# Patient Record
Sex: Male | Born: 1985
Health system: Southern US, Community
[De-identification: ages and names within clinical notes are randomized; demographics above are authoritative.]

## PROBLEM LIST (undated history)

## (undated) DIAGNOSIS — Z8659 Personal history of other mental and behavioral disorders: Secondary | ICD-10-CM

## (undated) DIAGNOSIS — T7840XA Allergy, unspecified, initial encounter: Secondary | ICD-10-CM

## (undated) DIAGNOSIS — E7800 Pure hypercholesterolemia, unspecified: Secondary | ICD-10-CM

## (undated) DIAGNOSIS — F32A Depression, unspecified: Secondary | ICD-10-CM

## (undated) DIAGNOSIS — J45909 Unspecified asthma, uncomplicated: Secondary | ICD-10-CM

## (undated) DIAGNOSIS — R911 Solitary pulmonary nodule: Secondary | ICD-10-CM

## (undated) DIAGNOSIS — G43909 Migraine, unspecified, not intractable, without status migrainosus: Secondary | ICD-10-CM

## (undated) DIAGNOSIS — I493 Ventricular premature depolarization: Secondary | ICD-10-CM

## (undated) HISTORY — PX: WISDOM TOOTH EXTRACTION: SHX21

## (undated) HISTORY — DX: Unspecified asthma, uncomplicated: J45.909

## (undated) HISTORY — DX: Depression, unspecified: F32.A

## (undated) HISTORY — DX: Solitary pulmonary nodule: R91.1

## (undated) HISTORY — DX: Migraine, unspecified, not intractable, without status migrainosus: G43.909

## (undated) HISTORY — DX: Pure hypercholesterolemia, unspecified: E78.00

## (undated) HISTORY — DX: Personal history of other mental and behavioral disorders: Z86.59

## (undated) HISTORY — DX: Ventricular premature depolarization: I49.3

## (undated) HISTORY — DX: Allergy, unspecified, initial encounter: T78.40XA

---

## 2018-12-29 ENCOUNTER — Telehealth: Payer: Self-pay

## 2018-12-29 NOTE — Telephone Encounter (Signed)
Questions for Screening COVID-19   During this illness, did/does the patient experience any of the following symptoms? Fever >100.1F []   Yes [x]   No []   Unknown Subjective fever (felt feverish) []   Yes [x]   No []   Unknown Chills []   Yes [x]   No []   Unknown Muscle aches (myalgia) []   Yes [x]   No []   Unknown Runny nose (rhinorrhea) []   Yes [x]   No []   Unknown Sore throat []   Yes [x]   No []   Unknown Cough (new onset or worsening of chronic cough) []   Yes [x]   No []   Unknown Shortness of breath (dyspnea) []   Yes []   No []   Unknown Nausea or vomiting []   Yes [x]   No []   Unknown Headache []   Yes [x]   No []   Unknown Abdominal pain  []   Yes [x]   No []   Unknown Diarrhea (?3 loose/looser than normal stools/24hr period) []   Yes [x]   No []   Unknown Other, specify:

## 2018-12-30 ENCOUNTER — Ambulatory Visit (INDEPENDENT_AMBULATORY_CARE_PROVIDER_SITE_OTHER): Payer: 59 | Admitting: Family Medicine

## 2018-12-30 ENCOUNTER — Other Ambulatory Visit: Payer: Self-pay

## 2018-12-30 ENCOUNTER — Telehealth: Payer: Self-pay

## 2018-12-30 ENCOUNTER — Encounter: Payer: Self-pay | Admitting: Family Medicine

## 2018-12-30 VITALS — BP 130/80 | HR 91 | Ht 72.0 in | Wt 162.2 lb

## 2018-12-30 DIAGNOSIS — Z0000 Encounter for general adult medical examination without abnormal findings: Secondary | ICD-10-CM

## 2018-12-30 DIAGNOSIS — R0789 Other chest pain: Secondary | ICD-10-CM

## 2018-12-30 DIAGNOSIS — Z720 Tobacco use: Secondary | ICD-10-CM | POA: Diagnosis not present

## 2018-12-30 DIAGNOSIS — I493 Ventricular premature depolarization: Secondary | ICD-10-CM

## 2018-12-30 NOTE — Patient Instructions (Signed)
Preventive Care 19-34 Years Old, Male Preventive care refers to lifestyle choices and visits with your health care provider that can promote health and wellness. This includes:  A yearly physical exam. This is also called an annual well check.  Regular dental and eye exams.  Immunizations.  Screening for certain conditions.  Healthy lifestyle choices, such as eating a healthy diet, getting regular exercise, not using drugs or products that contain nicotine and tobacco, and limiting alcohol use. What can I expect for my preventive care visit? Physical exam Your health care provider will check:  Height and weight. These may be used to calculate body mass index (BMI), which is a measurement that tells if you are at a healthy weight.  Heart rate and blood pressure.  Your skin for abnormal spots. Counseling Your health care provider may ask you questions about:  Alcohol, tobacco, and drug use.  Emotional well-being.  Home and relationship well-being.  Sexual activity.  Eating habits.  Work and work Statistician. What immunizations do I need?  Influenza (flu) vaccine  This is recommended every year. Tetanus, diphtheria, and pertussis (Tdap) vaccine  You may need a Td booster every 10 years. Varicella (chickenpox) vaccine  You may need this vaccine if you have not already been vaccinated. Human papillomavirus (HPV) vaccine  If recommended by your health care provider, you may need three doses over 6 months. Measles, mumps, and rubella (MMR) vaccine  You may need at least one dose of MMR. You may also need a second dose. Meningococcal conjugate (MenACWY) vaccine  One dose is recommended if you are 45-76 years old and a Market researcher living in a residence hall, or if you have one of several medical conditions. You may also need additional booster doses. Pneumococcal conjugate (PCV13) vaccine  You may need this if you have certain conditions and were not  previously vaccinated. Pneumococcal polysaccharide (PPSV23) vaccine  You may need one or two doses if you smoke cigarettes or if you have certain conditions. Hepatitis A vaccine  You may need this if you have certain conditions or if you travel or work in places where you may be exposed to hepatitis A. Hepatitis B vaccine  You may need this if you have certain conditions or if you travel or work in places where you may be exposed to hepatitis B. Haemophilus influenzae type b (Hib) vaccine  You may need this if you have certain risk factors. You may receive vaccines as individual doses or as more than one vaccine together in one shot (combination vaccines). Talk with your health care provider about the risks and benefits of combination vaccines. What tests do I need? Blood tests  Lipid and cholesterol levels. These may be checked every 5 years starting at age 17.  Hepatitis C test.  Hepatitis B test. Screening   Diabetes screening. This is done by checking your blood sugar (glucose) after you have not eaten for a while (fasting).  Sexually transmitted disease (STD) testing. Talk with your health care provider about your test results, treatment options, and if necessary, the need for more tests. Follow these instructions at home: Eating and drinking   Eat a diet that includes fresh fruits and vegetables, whole grains, lean protein, and low-fat dairy products.  Take vitamin and mineral supplements as recommended by your health care provider.  Do not drink alcohol if your health care provider tells you not to drink.  If you drink alcohol: ? Limit how much you have to 0-2  drinks a day. ? Be aware of how much alcohol is in your drink. In the [place] one drink equals one 12 oz bottle of beer (355 mL), one 5 oz glass of wine (148 mL), or one 1 oz glass of hard liquor (44 mL). Lifestyle  Take daily care of your teeth and gums.  Stay active. Exercise for at least 30 minutes on 5 or  more days each week.  Do not use any products that contain nicotine or tobacco, such as cigarettes, e-cigarettes, and chewing tobacco. If you need help quitting, ask your health care provider.  If you are sexually active, practice safe sex. Use a condom or other form of protection to prevent STIs (sexually transmitted infections). What's next?  Go to your health care provider once a year for a well check visit.  Ask your health care provider how often you should have your eyes and teeth checked.  Stay up to date on all vaccines. This information is not intended to replace advice given to you by your health care provider. Make sure you discuss any questions you have with your health care provider. Document Released: 06/24/2001 Document Revised: 04/22/2018 Document Reviewed: 04/22/2018 Elsevier Patient Education  2020 [employer]  Health Maintenance, Male Adopting a healthy lifestyle and getting preventive care are important in promoting health and wellness. Ask your health care provider about:  The right schedule for you to have regular tests and exams.  Things you can do on your own to prevent diseases and keep yourself healthy. What should I know about diet, weight, and exercise? Eat a healthy diet   Eat a diet that includes plenty of vegetables, fruits, low-fat dairy products, and lean protein.  Do not eat a lot of foods that are high in solid fats, added sugars, or sodium. Maintain a healthy weight Body mass index (BMI) is a measurement that can be used to identify possible weight problems. It estimates body fat based on height and weight. Your health care provider can help determine your BMI and help you achieve or maintain a healthy weight. Get regular exercise Get regular exercise. This is one of the most important things you can do for your health. Most adults should:  Exercise for at least 150 minutes each week. The exercise should increase your heart rate and make you  sweat (moderate-intensity exercise).  Do strengthening exercises at least twice a week. This is in addition to the moderate-intensity exercise.  Spend less time sitting. Even light physical activity can be beneficial. Watch cholesterol and blood lipids Have your blood tested for lipids and cholesterol at 34 years of age, then have this test every 5 years. You may need to have your cholesterol levels checked more often if:  Your lipid or cholesterol levels are high.  You are older than 34 years of age.  You are at high risk for heart disease. What should I know about cancer screening? Many types of cancers can be detected early and may often be prevented. Depending on your health history and family history, you may need to have cancer screening at various ages. This may include screening for:  Colorectal cancer.  Prostate cancer.  Skin cancer.  Lung cancer. What should I know about heart disease, diabetes, and high blood pressure? Blood pressure and heart disease  High blood pressure causes heart disease and increases the risk of stroke. This is more likely to develop in people who have high blood pressure readings, are of African descent, or are   overweight.  Talk with your health care provider about your target blood pressure readings.  Have your blood pressure checked: ? Every 3-5 years if you are 18-39 years of age. ? Every year if you are 40 years old or older.  If you are between the ages of 65 and 75 and are a current or former smoker, ask your health care provider if you should have a one-time screening for abdominal aortic aneurysm (AAA). Diabetes Have regular diabetes screenings. This checks your fasting blood sugar level. Have the screening done:  Once every three years after age 45 if you are at a normal weight and have a low risk for diabetes.  More often and at a younger age if you are overweight or have a high risk for diabetes. What should I know about  preventing infection? Hepatitis B If you have a higher risk for hepatitis B, you should be screened for this virus. Talk with your health care provider to find out if you are at risk for hepatitis B infection. Hepatitis C Blood testing is recommended for:  Everyone born from 1945 through 1965.  Anyone with known risk factors for hepatitis C. Sexually transmitted infections (STIs)  You should be screened each year for STIs, including gonorrhea and chlamydia, if: ? You are sexually active and are younger than 34 years of age. ? You are older than 34 years of age and your health care provider tells you that you are at risk for this type of infection. ? Your sexual activity has changed since you were last screened, and you are at increased risk for chlamydia or gonorrhea. Ask your health care provider if you are at risk.  Ask your health care provider about whether you are at high risk for HIV. Your health care provider may recommend a prescription medicine to help prevent HIV infection. If you choose to take medicine to prevent HIV, you should first get tested for HIV. You should then be tested every 3 months for as long as you are taking the medicine. Follow these instructions at home: Lifestyle  Do not use any products that contain nicotine or tobacco, such as cigarettes, e-cigarettes, and chewing tobacco. If you need help quitting, ask your health care provider.  Do not use street drugs.  Do not share needles.  Ask your health care provider for help if you need support or information about quitting drugs. Alcohol use  Do not drink alcohol if your health care provider tells you not to drink.  If you drink alcohol: ? Limit how much you have to 0-2 drinks a day. ? Be aware of how much alcohol is in your drink. In the [place] one drink equals one 12 oz bottle of beer (355 mL), one 5 oz glass of wine (148 mL), or one 1 oz glass of hard liquor (44 mL). General instructions  Schedule  regular health, dental, and eye exams.  Stay current with your vaccines.  Tell your health care provider if: ? You often feel depressed. ? You have ever been abused or do not feel safe at home. Summary  Adopting a healthy lifestyle and getting preventive care are important in promoting health and wellness.  Follow your health care provider's instructions about healthy diet, exercising, and getting tested or screened for diseases.  Follow your health care provider's instructions on monitoring your cholesterol and blood pressure. This information is not intended to replace advice given to you by your health care provider. Make sure you discuss   any questions you have with your health care provider. Document Released: 10/25/2007 Document Revised: 04/21/2018 Document Reviewed: 04/21/2018 Elsevier Patient Education  2020 [employer].  Steps to Quit Smoking Smoking tobacco is the leading cause of preventable death. It can affect almost every organ in the body. Smoking puts you and those around you at risk for developing many serious chronic diseases. Quitting smoking can be difficult, but it is one of the best things that you can do for your health. It is never too late to quit. How do I get ready to quit? When you decide to quit smoking, create a plan to help you succeed. Before you quit:  Pick a date to quit. Set a date within the next 2 weeks to give you time to prepare.  Write down the reasons why you are quitting. Keep this list in places where you will see it often.  Tell your family, friends, and co-workers that you are quitting. Support from your loved ones can make quitting easier.  Talk with your health care provider about your options for quitting smoking.  Find out what treatment options are covered by your health insurance.  Identify people, places, things, and activities that make you want to smoke (triggers). Avoid them. What first steps can I take to quit smoking?   Throw away all cigarettes at home, at work, and in your car.  Throw away smoking accessories, such as Scientist, research (medical).  Clean your car. Make sure to empty the ashtray.  Clean your home, including curtains and carpets. What strategies can I use to quit smoking? Talk with your health care provider about combining strategies, such as taking medicines while you are also receiving in-person counseling. Using these two strategies together makes you more likely to succeed in quitting than if you used either strategy on its own.  If you are pregnant or breastfeeding, talk with your health care provider about finding counseling or other support strategies to quit smoking. Do not take medicine to help you quit smoking unless your health care provider tells you to do so. To quit smoking: Quit right away  Quit smoking completely, instead of gradually reducing how much you smoke over a period of time. Research shows that stopping smoking right away is more successful than gradually quitting.  Attend in-person counseling to help you build problem-solving skills. You are more likely to succeed in quitting if you attend counseling sessions regularly. Even short sessions of 10 minutes can be effective. Take medicine You may take medicines to help you quit smoking. Some medicines require a prescription and some you can purchase over-the-counter. Medicines may have nicotine in them to replace the nicotine in cigarettes. Medicines may:  Help to stop cravings.  Help to relieve withdrawal symptoms. Your health care provider may recommend:  Nicotine patches, gum, or lozenges.  Nicotine inhalers or sprays.  Non-nicotine medicine that is taken by mouth. Find resources Find resources and support systems that can help you to quit smoking and remain smoke-free after you quit. These resources are most helpful when you use them often. They include:  Online chats with a Social worker.  Telephone quitlines.   Printed Furniture conservator/restorer.  Support groups or group counseling.  Text messaging programs.  Mobile phone apps or applications. Use apps that can help you stick to your quit plan by providing reminders, tips, and encouragement. There are many free apps for mobile devices as well as websites. Examples include Quit Guide from the [employer] and smokefree.gov What things  can I do to make it easier to quit?   Reach out to your family and friends for support and encouragement. Call telephone quitlines (1-800-QUIT-NOW), reach out to support groups, or work with a counselor for support.  Ask people who smoke to avoid smoking around you.  Avoid places that trigger you to smoke, such as bars, parties, or smoke-break areas at work.  Spend time with people who do not smoke.  Lessen the stress in your life. Stress can be a smoking trigger for some people. To lessen stress, try: ? Exercising regularly. ? Doing deep-breathing exercises. ? Doing yoga. ? Meditating. ? Performing a body scan. This involves closing your eyes, scanning your body from head to toe, and noticing which parts of your body are particularly tense. Try to relax the muscles in those areas. How will I feel when I quit smoking? Day 1 to 3 weeks Within the first 24 hours of quitting smoking, you may start to feel withdrawal symptoms. These symptoms are usually most noticeable 2-3 days after quitting, but they usually do not last for more than 2-3 weeks. You may experience these symptoms:  Mood swings.  Restlessness, anxiety, or irritability.  Trouble concentrating.  Dizziness.  Strong cravings for sugary foods and nicotine.  Mild weight gain.  Constipation.  Nausea.  Coughing or a sore throat.  Changes in how the medicines that you take for unrelated issues work in your body.  Depression.  Trouble sleeping (insomnia). Week 3 and afterward After the first 2-3 weeks of quitting, you may start to notice more positive  results, such as:  Improved sense of smell and taste.  Decreased coughing and sore throat.  Slower heart rate.  Lower blood pressure.  Clearer skin.  The ability to breathe more easily.  Fewer sick days. Quitting smoking can be very challenging. Do not get discouraged if you are not successful the first time. Some people need to make many attempts to quit before they achieve long-term success. Do your best to stick to your quit plan, and talk with your health care provider if you have any questions or concerns. Summary  Smoking tobacco is the leading cause of preventable death. Quitting smoking is one of the best things that you can do for your health.  When you decide to quit smoking, create a plan to help you succeed.  Quit smoking right away, not slowly over a period of time.  When you start quitting, seek help from your health care provider, family, or friends. This information is not intended to replace advice given to you by your health care provider. Make sure you discuss any questions you have with your health care provider. Document Released: 04/22/2001 Document Revised: 07/16/2018 Document Reviewed: 07/17/2018 Elsevier Patient Education  2020 [employer].

## 2018-12-30 NOTE — Progress Notes (Addendum)
Established Patient Office Visit  Subjective:  Patient ID: Joseph Velasquez, male    DOB: 13-Jun-1985  Age: 33 y.o. MRN: [id number]  CC:  Chief Complaint  Patient presents with  . Establish Care    HPI ALEXJANDRO Velasquez presents for establishment of care and follow-up of few issues.  Patient has a past medical history of PVCs.  He is status post work-up with Holter monitoring.  He was advised to decrease his caffeine intake and to quit smoking.  Decreasing caffeine intake helped a great deal.  PVCs have not bothered him much recently but he is concerned about his heart.  His father does not go to the doctor.  He has 2 paternal uncles who developed coronary artery disease in their 87s.  Patient feels stressed.  He moved into this area back in January from [place].  He is worried about his family in [place].  He is married and has no children.  He and his wife are getting along well.  He repairs MRI and CT machines.  He exercises regularly.  He has noted some pain under his left collarbone that was not associated with exertion except for one time when he was hiking up a steep cliff.  He has not been experiencing shortness of breath nausea or vomiting or diaphoresis.  He tells me that blood work checked back in 2018 to include CMP, CBC TSH free T3 were all normal.  He had taken an antidepressant and Ambien for sleep after leaving the [employer] some years ago.  Patient averages about 3 beers daily.  He has 1 after work.  One with dinner and then 1 after dinner with his wife.  History reviewed. No pertinent past medical history.  History reviewed. No pertinent surgical history.  History reviewed. No pertinent family history.  Social History   Socioeconomic History  . Marital status: Married    Spouse name: Not on file  . Number of children: Not on file  . Years of education: Not on file  . Highest education level: Not on file  Occupational History  . Not on file  Social Needs  . Financial  resource strain: Not on file  . Food insecurity    Worry: Not on file    Inability: Not on file  . Transportation needs    Medical: Not on file    Non-medical: Not on file  Tobacco Use  . Smoking status: Current Every Day Smoker  . Smokeless tobacco: Never Used  Substance and Sexual Activity  . Alcohol use: Yes    Comment: 3 beers daily  . Drug use: Never  . Sexual activity: Not on file  Lifestyle  . Physical activity    Days per week: Not on file    Minutes per session: Not on file  . Stress: Not on file  Relationships  . Social Herbalist on phone: Not on file    Gets together: Not on file    Attends religious service: Not on file    Active member of club or organization: Not on file    Attends meetings of clubs or organizations: Not on file    Relationship status: Not on file  . Intimate partner violence    Fear of current or ex partner: Not on file    Emotionally abused: Not on file    Physically abused: Not on file    Forced sexual activity: Not on file  Other Topics Concern  . Not  on file  Social History Narrative  . Not on file    No outpatient medications prior to visit.   No facility-administered medications prior to visit.     Not on File  ROS Review of Systems  Constitutional: Negative for chills, diaphoresis, fatigue, fever and unexpected weight change.  HENT: Negative.   Eyes: Negative for photophobia and visual disturbance.  Respiratory: Negative.   Cardiovascular: Negative.   Gastrointestinal: Negative.   Endocrine: Negative for polyphagia and polyuria.  Genitourinary: Negative for difficulty urinating and urgency.  Musculoskeletal: Negative for gait problem and joint swelling.  Skin: Negative for pallor and rash.  Allergic/Immunologic: Negative for immunocompromised state.  Neurological: Negative for seizures, light-headedness and headaches.  Hematological: Does not bruise/bleed easily.  Psychiatric/Behavioral: Negative.         Depression screen [facility] 2/9 12/30/2018  Decreased Interest 0  Down, Depressed, Hopeless 0  PHQ - 2 Score 0  Altered sleeping 2  Tired, decreased energy 1  Change in appetite 1  Feeling bad or failure about yourself  1  Trouble concentrating 0  Moving slowly or fidgety/restless 1  Suicidal thoughts 0  PHQ-9 Score 6    Objective:    Physical Exam  Constitutional: He is oriented to person, place, and time. He appears well-developed and well-nourished. No distress.  HENT:  Head: Normocephalic and atraumatic.  Right Ear: External ear normal.  Left Ear: External ear normal.  Mouth/Throat: Oropharynx is clear and moist. No oropharyngeal exudate.  Eyes: Pupils are equal, round, and reactive to light. Conjunctivae are normal. Right eye exhibits no discharge. Left eye exhibits no discharge. No scleral icterus.  Neck: Neck supple. No JVD present. No tracheal deviation present. No thyromegaly present.  Cardiovascular: Normal rate, regular rhythm and normal heart sounds.  No extrasystoles are present.  No murmur heard. Pulmonary/Chest: Effort normal and breath sounds normal. No stridor. No respiratory distress. He has no wheezes. He has no rales.  Abdominal: Bowel sounds are normal.  Musculoskeletal:        General: No edema.  Lymphadenopathy:    He has no cervical adenopathy.  Neurological: He is alert and oriented to person, place, and time.  Skin: Skin is warm and dry. He is not diaphoretic.  Psychiatric: He has a normal mood and affect. His behavior is normal.    BP 130/80   Pulse 91   Ht 6' (1.829 m)   Wt 162 lb 3 oz (73.6 kg)   BMI 22.00 kg/m  Wt Readings from Last 3 Encounters:  12/30/18 162 lb 3 oz (73.6 kg)   BP Readings from Last 3 Encounters:  12/30/18 130/80   Guideline developer:  UpToDate (see UpToDate for funding source) Date Released: June 2014  Health Maintenance Due  Topic Date Due  . HIV Screening  12/21/1999  . TETANUS/TDAP  12/21/2003  . INFLUENZA  VACCINE  12/11/2018    There are no preventive care reminders to display for this patient.  Lab Results  Component Value Date   TSH 0.98 12/31/2018   Lab Results  Component Value Date   WBC 5.2 12/31/2018   HGB 15.4 12/31/2018   HCT 44.3 12/31/2018   MCV 93.5 12/31/2018   PLT 273.0 12/31/2018   Lab Results  Component Value Date   NA 137 12/31/2018   K 4.5 12/31/2018   CO2 27 12/31/2018   GLUCOSE 105 (H) 12/31/2018   BUN 8 12/31/2018   CREATININE 0.77 12/31/2018   BILITOT 0.4 12/31/2018   ALKPHOS 74  12/31/2018   AST 30 12/31/2018   ALT 39 12/31/2018   PROT 7.9 12/31/2018   ALBUMIN 5.1 12/31/2018   CALCIUM 10.0 12/31/2018   GFR 115.63 12/31/2018   Lab Results  Component Value Date   CHOL 225 (H) 12/31/2018   Lab Results  Component Value Date   HDL 55.60 12/31/2018   Lab Results  Component Value Date   LDLCALC 154 (H) 12/31/2018   Lab Results  Component Value Date   TRIG 76.0 12/31/2018   Lab Results  Component Value Date   CHOLHDL 4 12/31/2018   No results found for: HGBA1C    Assessment & Plan:   Problem List Items Addressed This Visit      Cardiovascular and Mediastinum   Asymptomatic PVCs - Primary   Relevant Orders   TSH (Completed)     Other   Healthcare maintenance   Relevant Orders   CBC (Completed)   Comprehensive metabolic panel (Completed)   LDL cholesterol, direct (Completed)   Lipid panel (Completed)   Urinalysis, Routine w reflex microscopic (Completed)   Tobacco use   Atypical chest pain   Relevant Orders   Ambulatory referral to Cardiology      No orders of the defined types were placed in this encounter.   Follow-up: Return in about 2 weeks (around 01/13/2019), or return fasting for labs and then in 2 weeks for follow up to see me..   We discussed starting Paxil.  He would like to think about it.  I asked him to ask his wife what she would think about that.  Invited him to have her accompany him for follow-up in a  couple weeks.  Patient will return fasting for above ordered blood work in the near future.  He was given information on health maintenance and disease prevention.  He was given information on steps to quit smoking.

## 2018-12-30 NOTE — Telephone Encounter (Signed)
During this illness, did/does the patient experience any of the following symptoms? Fever >100.58F []   Yes [x]   No []   Unknown Subjective fever (felt feverish) []   Yes [x]   No []   Unknown Chills []   Yes [x]   No []   Unknown Muscle aches (myalgia) []   Yes [x]   No []   Unknown Runny nose (rhinorrhea) []   Yes [x]   No []   Unknown Sore throat []   Yes [x]   No []   Unknown Cough (new onset or worsening of chronic cough) []   Yes [x]   No []   Unknown Shortness of breath (dyspnea) []   Yes [x]   No []   Unknown Nausea or vomiting []   Yes [x]   No []   Unknown Headache []   Yes [x]   No []   Unknown Abdominal pain  []   Yes [x]   No []   Unknown Diarrhea (?3 loose/looser than normal stools/24hr period) []   Yes [x]   No []   Unknown Other, specify:  Patient risk factors:

## 2018-12-31 ENCOUNTER — Other Ambulatory Visit (INDEPENDENT_AMBULATORY_CARE_PROVIDER_SITE_OTHER): Payer: 59

## 2018-12-31 DIAGNOSIS — R0789 Other chest pain: Secondary | ICD-10-CM | POA: Insufficient documentation

## 2018-12-31 DIAGNOSIS — Z0000 Encounter for general adult medical examination without abnormal findings: Secondary | ICD-10-CM

## 2018-12-31 DIAGNOSIS — I493 Ventricular premature depolarization: Secondary | ICD-10-CM

## 2018-12-31 LAB — URINALYSIS, ROUTINE W REFLEX MICROSCOPIC
Bilirubin Urine: NEGATIVE
Hgb urine dipstick: NEGATIVE
Ketones, ur: NEGATIVE
Leukocytes,Ua: NEGATIVE
Nitrite: NEGATIVE
RBC / HPF: NONE SEEN (ref 0–?)
Specific Gravity, Urine: 1.015 (ref 1.000–1.030)
Total Protein, Urine: NEGATIVE
Urine Glucose: NEGATIVE
Urobilinogen, UA: 0.2 (ref 0.0–1.0)
pH: 8 (ref 5.0–8.0)

## 2018-12-31 LAB — CBC
HCT: 44.3 % (ref 39.0–52.0)
Hemoglobin: 15.4 g/dL (ref 13.0–17.0)
MCHC: 34.7 g/dL (ref 30.0–36.0)
MCV: 93.5 fl (ref 78.0–100.0)
Platelets: 273 10*3/uL (ref 150.0–400.0)
RBC: 4.74 Mil/uL (ref 4.22–5.81)
RDW: 12.7 % (ref 11.5–15.5)
WBC: 5.2 10*3/uL (ref 4.0–10.5)

## 2018-12-31 LAB — LIPID PANEL
Cholesterol: 225 mg/dL — ABNORMAL HIGH (ref 0–200)
HDL: 55.6 mg/dL (ref >39.00)
LDL Cholesterol: 154 mg/dL — ABNORMAL HIGH (ref 0–99)
NonHDL: 169.14
Total CHOL/HDL Ratio: 4
Triglycerides: 76 mg/dL (ref 0.0–149.0)
VLDL: 15.2 mg/dL (ref 0.0–40.0)

## 2018-12-31 LAB — LDL CHOLESTEROL, DIRECT: Direct LDL: 137 mg/dL

## 2018-12-31 LAB — COMPREHENSIVE METABOLIC PANEL
ALT: 39 U/L (ref 0–53)
AST: 30 U/L (ref 0–37)
Albumin: 5.1 g/dL (ref 3.5–5.2)
Alkaline Phosphatase: 74 U/L (ref 39–117)
BUN: 8 mg/dL (ref 6–23)
CO2: 27 mEq/L (ref 19–32)
Calcium: 10 mg/dL (ref 8.4–10.5)
Chloride: 102 mEq/L (ref 96–112)
Creatinine, Ser: 0.77 mg/dL (ref 0.40–1.50)
GFR: 115.63 mL/min (ref >60.00)
Glucose, Bld: 105 mg/dL — ABNORMAL HIGH (ref 70–99)
Potassium: 4.5 mEq/L (ref 3.5–5.1)
Sodium: 137 mEq/L (ref 135–145)
Total Bilirubin: 0.4 mg/dL (ref 0.2–1.2)
Total Protein: 7.9 g/dL (ref 6.0–8.3)

## 2018-12-31 LAB — TSH: TSH: 0.98 u[IU]/mL (ref 0.35–4.50)

## 2018-12-31 NOTE — Addendum Note (Signed)
Addended by: Jon Billings on: 12/31/2018 01:22 PM   Modules accepted: Orders

## 2019-01-03 ENCOUNTER — Encounter: Payer: Self-pay | Admitting: Family Medicine

## 2019-01-06 ENCOUNTER — Telehealth: Payer: Self-pay

## 2019-01-06 NOTE — Telephone Encounter (Signed)
Can we check on pt's cardiology referral? Thanks!

## 2019-01-12 ENCOUNTER — Telehealth: Payer: Self-pay

## 2019-01-12 NOTE — Telephone Encounter (Signed)

## 2019-01-13 ENCOUNTER — Ambulatory Visit: Payer: 59 | Admitting: Family Medicine

## 2019-01-13 ENCOUNTER — Other Ambulatory Visit: Payer: Self-pay

## 2019-01-13 ENCOUNTER — Ambulatory Visit (INDEPENDENT_AMBULATORY_CARE_PROVIDER_SITE_OTHER): Payer: 59 | Admitting: Family Medicine

## 2019-01-13 ENCOUNTER — Encounter: Payer: Self-pay | Admitting: Family Medicine

## 2019-01-13 VITALS — BP 128/80 | HR 101 | Ht 72.0 in | Wt 160.0 lb

## 2019-01-13 DIAGNOSIS — F419 Anxiety disorder, unspecified: Secondary | ICD-10-CM

## 2019-01-13 MED ORDER — PAROXETINE HCL 10 MG PO TABS: 10.0000 mg | ORAL_TABLET | Freq: Every day | ORAL | 1 refills | Status: DC

## 2019-01-13 MED FILL — PARoxetine HCL 10 MG TABS: 10 | 30 days supply | Qty: 30 | Fill #0

## 2019-01-13 NOTE — Progress Notes (Signed)
Established Patient Office Visit  Subjective:  Patient ID: Joseph Velasquez, male    DOB: June 20, 1985  Age: 33 y.o. MRN: [id number]  CC:  Chief Complaint  Patient presents with  . Follow-up    HPI Joseph Velasquez presents for follow-up of his asymptomatic PVCs.  He is lowering the fat and cholesterol in his diet.  He is cut back on his caffeine.  He is drinking less beer.  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  He has an appointment with cardiology on the 29th.  History reviewed. No pertinent past medical history.  History reviewed. No pertinent surgical history.  History reviewed. No pertinent family history.  Social History   Socioeconomic History  . Marital status: Married    Spouse name: Not on file  . Number of children: Not on file  . Years of education: Not on file  . Highest education level: Not on file  Occupational History  . Not on file  Social Needs  . Financial resource strain: Not on file  . Food insecurity    Worry: Not on file    Inability: Not on file  . Transportation needs    Medical: Not on file    Non-medical: Not on file  Tobacco Use  . Smoking status: Current Every Day Smoker  . Smokeless tobacco: Never Used  Substance and Sexual Activity  . Alcohol use: Yes    Comment: 3 beers daily  . Drug use: Never  . Sexual activity: Not on file  Lifestyle  . Physical activity    Days per week: Not on file    Minutes per session: Not on file  . Stress: Not on file  Relationships  . Social Herbalist on phone: Not on file    Gets together: Not on file    Attends religious service: Not on file    Active member of club or organization: Not on file    Attends meetings of clubs or organizations: Not on file    Relationship status: Not on file  . Intimate partner violence    Fear of current or ex partner: Not on file    Emotionally abused: Not on file    Physically  abused: Not on file    Forced sexual activity: Not on file  Other Topics Concern  . Not on file  Social History Narrative  . Not on file    No outpatient medications prior to visit.   No facility-administered medications prior to visit.     Not on File  ROS Review of Systems  Constitutional: Negative.   Respiratory: Negative.  Negative for shortness of breath.   Cardiovascular: Positive for palpitations. Negative for chest pain.  Gastrointestinal: Negative.   Skin: Negative for pallor and rash.  Psychiatric/Behavioral: Negative for dysphoric mood. The patient is nervous/anxious.       Objective:    Physical Exam  Constitutional: He is oriented to person, place, and time. He appears well-developed and well-nourished. No distress.  HENT:  Head: Normocephalic and atraumatic.  Right Ear: External ear normal.  Left Ear: External ear normal.  Eyes: Right eye exhibits no discharge. Left eye exhibits no discharge. No scleral icterus.  Cardiovascular: Normal rate, regular rhythm and normal heart sounds.  Pulmonary/Chest: Effort normal.  Neurological: He is alert and oriented to person, place, and time.  Fidgety during the interview.  Skin: Skin is warm and dry. He is not diaphoretic.  Psychiatric: He has a normal mood and affect. His behavior is normal.    BP 128/80   Pulse (!) 101   Ht 6' (1.829 m)   Wt 160 lb (72.6 kg)   SpO2 98%   BMI 21.70 kg/m  Wt Readings from Last 3 Encounters:  01/13/19 160 lb (72.6 kg)  12/30/18 162 lb 3 oz (73.6 kg)   BP Readings from Last 3 Encounters:  01/13/19 128/80  12/30/18 130/80   Guideline developer:  UpToDate (see UpToDate for funding source) Date Released: June 2014  Health Maintenance Due  Topic Date Due  . HIV Screening  12/21/1999  . TETANUS/TDAP  12/21/2003  . INFLUENZA VACCINE  12/11/2018    There are no preventive care reminders to display for this patient.  Lab Results  Component Value Date   TSH 0.98  12/31/2018   Lab Results  Component Value Date   WBC 5.2 12/31/2018   HGB 15.4 12/31/2018   HCT 44.3 12/31/2018   MCV 93.5 12/31/2018   PLT 273.0 12/31/2018   Lab Results  Component Value Date   NA 137 12/31/2018   K 4.5 12/31/2018   CO2 27 12/31/2018   GLUCOSE 105 (H) 12/31/2018   BUN 8 12/31/2018   CREATININE 0.77 12/31/2018   BILITOT 0.4 12/31/2018   ALKPHOS 74 12/31/2018   AST 30 12/31/2018   ALT 39 12/31/2018   PROT 7.9 12/31/2018   ALBUMIN 5.1 12/31/2018   CALCIUM 10.0 12/31/2018   GFR 115.63 12/31/2018   Lab Results  Component Value Date   CHOL 225 (H) 12/31/2018   Lab Results  Component Value Date   HDL 55.60 12/31/2018   Lab Results  Component Value Date   LDLCALC 154 (H) 12/31/2018   Lab Results  Component Value Date   TRIG 76.0 12/31/2018   Lab Results  Component Value Date   CHOLHDL 4 12/31/2018   No results found for: HGBA1C    Assessment & Plan:   Problem List Items Addressed This Visit    None    Visit Diagnoses    Anxiety    -  Primary   Relevant Medications   PARoxetine (PAXIL) 10 MG tablet      Meds ordered this encounter  Medications  . PARoxetine (PAXIL) 10 MG tablet    Sig: Take 1 tablet (10 mg total) by mouth daily.    Dispense:  30 tablet    Refill:  1    Follow-up: Return in about 1 month (around 02/12/2019).   We talked about adjusting the Paxil and the time of day that he takes it.  We discussed possible side effects.  He is aware that it is a low dose and is unlikely to elevate his mood but less likely to cause side effect.

## 2019-01-19 ENCOUNTER — Other Ambulatory Visit: Payer: Self-pay

## 2019-01-19 ENCOUNTER — Encounter: Payer: Self-pay | Admitting: Cardiology

## 2019-01-19 ENCOUNTER — Ambulatory Visit (INDEPENDENT_AMBULATORY_CARE_PROVIDER_SITE_OTHER): Payer: 59 | Admitting: Cardiology

## 2019-01-19 VITALS — BP 126/74 | HR 89 | Ht 72.0 in | Wt 161.4 lb

## 2019-01-19 DIAGNOSIS — R072 Precordial pain: Secondary | ICD-10-CM

## 2019-01-19 DIAGNOSIS — Z716 Tobacco abuse counseling: Secondary | ICD-10-CM | POA: Diagnosis not present

## 2019-01-19 DIAGNOSIS — Z7189 Other specified counseling: Secondary | ICD-10-CM

## 2019-01-19 DIAGNOSIS — E785 Hyperlipidemia, unspecified: Secondary | ICD-10-CM | POA: Insufficient documentation

## 2019-01-19 DIAGNOSIS — I493 Ventricular premature depolarization: Secondary | ICD-10-CM | POA: Diagnosis not present

## 2019-01-19 DIAGNOSIS — Z720 Tobacco use: Secondary | ICD-10-CM

## 2019-01-19 DIAGNOSIS — E7800 Pure hypercholesterolemia, unspecified: Secondary | ICD-10-CM | POA: Diagnosis not present

## 2019-01-19 DIAGNOSIS — Z8249 Family history of ischemic heart disease and other diseases of the circulatory system: Secondary | ICD-10-CM | POA: Diagnosis not present

## 2019-01-19 MED ORDER — NICOTINE 10 MG IN INHA: 1.0000 | RESPIRATORY_TRACT | 3 refills | Status: DC | PRN

## 2019-01-19 NOTE — Patient Instructions (Signed)
Medication Instructions:  Your Physician recommend you continue on your current medication as directed.    If you need a refill on your cardiac medications before your next appointment, please call your pharmacy.   Lab work: None  Testing/Procedures: None  Follow-Up: At [facility], you and your health needs are our priority.  As part of our continuing mission to provide you with exceptional heart care, we have created designated Provider Care Teams.  These Care Teams include your primary Cardiologist (physician) and Advanced Practice Providers (APPs -  Physician Assistants and Nurse Practitioners) who all work together to provide you with the care you need, when you need it. You will need a follow up appointment in 3 months.  Please call our office 2 months in advance to schedule this appointment.  You may see Dr. Christopher or one of the following Advanced Practice Providers on your designated Care Team:   Rhonda Barrett, PA-C . Kathryn Lawrence, DNP, ANP      

## 2019-01-19 NOTE — Progress Notes (Signed)
Cardiology Office Note:    Date:  01/19/2019   ID:  Joseph Velasquez, DOB March 10, 1986, MRN [id number]  PCP:  Libby Maw, MD  Cardiologist:  Buford Dresser, MD PhD  Referring MD: Libby Maw,*   CC: referred for new consult re: history of PVCs, chest pain.  History of Present Illness:    Joseph Velasquez is a 33 y.o. male with a hx of PVCs who is seen as a new consult at the request of Libby Maw,* for the evaluation and management of chest pain.  Chest pain:  3 different symptoms: feels like heart stops and then double beats, sometimes once, sometimes 10-15 seconds. This was first symptoms. First monitor worn in 2006. Was told it was PVCs. 2nd symptom: pain under upper left arm. Throbbing. No radiation. Most frequent, last occurring today. Prior time was 4-5 days ago, lasted for 3 days.  3rd symptom: pain over top of chest near collarbone. Sharp. No radiation. Last event was two weeks ago while mowing grass, lasted 1/2 day.   -Associated symptoms: only with abnormal heart beat, gets brief dizzines and then resolves. No other symptoms -Aggravating/alleviating factors: stress makes it worse -Prior cardiac history: PVCs 2006 on monitor -Prior ECG: NSR -Prior workup: Holter monitor x2, treadmill stress test -Prior treatment: none; though just started taking Paxil several days ago  -Alcohol: few beers/day -Tobacco: current smoker, 1/2 ppd. For 15 years. Interested in quitting, discussed at length today. -Comorbidities: none -Exercise level: walking daily, 30 mins. Goes kayaking, hiking, etc. -Cardiac ROS: no shortness of breath, no PND, no orthopnea, no LE edema, no syncope -Family history: father doesn't go to doctor, but had normal stress test. Each of father's brothers have had MI, one with 4V CABG. One with PAD. Father's sister may also have an unknown history. Mother's brother is heavy drinker, had heart issues. Other mother's brother had MI. No  one in 30s/40s.  PMH:  anxiety, just started Paxil Tobacco abuse  History reviewed. No pertinent surgical history.  Current Medications: Current Outpatient Medications on File Prior to Visit  Medication Sig  . PARoxetine (PAXIL) 10 MG tablet Take 1 tablet (10 mg total) by mouth daily.   No current facility-administered medications on file prior to visit.      Allergies:   Patient has no known allergies.   Social History   Socioeconomic History  . Marital status: Married    Spouse name: Not on file  . Number of children: Not on file  . Years of education: Not on file  . Highest education level: Not on file  Occupational History  . Not on file  Social Needs  . Financial resource strain: Not on file  . Food insecurity    Worry: Not on file    Inability: Not on file  . Transportation needs    Medical: Not on file    Non-medical: Not on file  Tobacco Use  . Smoking status: Current Every Day Smoker  . Smokeless tobacco: Never Used  Substance and Sexual Activity  . Alcohol use: Yes    Comment: 3 beers daily  . Drug use: Never  . Sexual activity: Not on file  Lifestyle  . Physical activity    Days per week: Not on file    Minutes per session: Not on file  . Stress: Not on file  Relationships  . Social Herbalist on phone: Not on file    Gets together: Not on file  Attends religious service: Not on file    Active member of club or organization: Not on file    Attends meetings of clubs or organizations: Not on file    Relationship status: Not on file  Other Topics Concern  . Not on file  Social History Narrative  . Not on file     Family History: father doesn't go to doctor, but had normal stress test. Each of father's brothers have had MI, one with 4V CABG. One with PAD. Father's sister may also have an unknown history. Mother's brother is heavy drinker, had heart issues. Other mother's brother had MI. No one in 30s/40s  ROS:   Please see the  history of present illness.  Additional pertinent ROS: Constitutional: Negative for chills, fever, night sweats, unintentional weight loss  HENT: Negative for ear pain and hearing loss.   Eyes: Negative for loss of vision and eye pain.  Respiratory: Negative for cough, sputum, wheezing.   Cardiovascular: See HPI. Gastrointestinal: Negative for abdominal pain, melena, and hematochezia.  Genitourinary: Negative for dysuria and hematuria.  Musculoskeletal: Negative for falls and myalgias.  Skin: Negative for itching and rash.  Neurological: Negative for focal weakness, focal sensory changes and loss of consciousness.  Endo/Heme/Allergies: Does not bruise/bleed easily.     EKGs/Labs/Other Studies Reviewed:    The following studies were reviewed today: Prior notes  EKG:  EKG is personally reviewed.  The ekg ordered today demonstrates NSR at 89 bpm  Recent Labs: 12/31/2018: ALT 39; BUN 8; Creatinine, Ser 0.77; Hemoglobin 15.4; Platelets 273.0; Potassium 4.5; Sodium 137; TSH 0.98  Recent Lipid Panel    Component Value Date/Time   CHOL 225 (H) 12/31/2018 0809   TRIG 76.0 12/31/2018 0809   HDL 55.60 12/31/2018 0809   CHOLHDL 4 12/31/2018 0809   VLDL 15.2 12/31/2018 0809   LDLCALC 154 (H) 12/31/2018 0809   LDLDIRECT 137.0 12/31/2018 0809    Physical Exam:    VS:  BP 126/74   Pulse 89   Ht 6' (1.829 m)   Wt 161 lb 6.4 oz (73.2 kg)   BMI 21.89 kg/m     Wt Readings from Last 3 Encounters:  01/19/19 161 lb 6.4 oz (73.2 kg)  01/13/19 160 lb (72.6 kg)  12/30/18 162 lb 3 oz (73.6 kg)    GEN: Well nourished, well developed in no acute distress HEENT: Normal, moist mucous membranes NECK: No JVD CARDIAC: regular rhythm, normal S1 and S2, no murmurs, rubs, gallops.  VASCULAR: Radial and DP pulses 2+ bilaterally. No carotid bruits RESPIRATORY:  Clear to auscultation without rales, wheezing or rhonchi  ABDOMEN: Soft, non-tender, non-distended MUSCULOSKELETAL:  Ambulates independently  SKIN: Warm and dry, no edema NEUROLOGIC:  Alert and oriented x 3. No focal neuro deficits noted. PSYCHIATRIC:  Normal affect    ASSESSMENT:    1. Encounter for tobacco use cessation counseling   2. Precordial pain   3. Tobacco use   4. Pure hypercholesterolemia   5. Family history of heart disease   6. Cardiac risk counseling   7. Counseling on health promotion and disease prevention    PLAN:    Precordial pain: reports a prior unremarkable stress test in the past. -discussed treadmill stress, nuclear stress/lexiscan, and CT coronary angiography. Discussed pros and cons of each, including but not limited to false positive/false negative risk, radiation risk, and risk of IV contrast dye.  -Based on prior testing, if he wishes to purse further evaluation, I recommend CT coronary angiography. -would  need one time dose of metoprolol, SL NG -he will discuss with his wife and let use know what he would like to do  PVCs: none noted today -reports prior Holters with only occasional PVCs -discussed that if this becomes more frequent, would pursue Zio  Tobacco abuse and counseling: The patient was counseled on tobacco cessation today for 10 minutes.  Counseling included reviewing the risks of smoking tobacco products, how it impacts the patient's current medical diagnoses and different strategies for quitting.  Pharmacotherapy to aid in tobacco cessation was prescribed today. -interested in nicotrol inhaler. Prescribed to [facility] pharmacy today. If cost is prohibitive, he will look into alternate options.  Hypercholesterolemia: LDL 154, which is elevated based on his overall health and body habitus.  -discussed lifestyle recommendations, which he already follows well -while he is young, the elevated LDL is a risk for him -if he pursues CT and there is evidence of calcium/CAD, would start statin given his numbers -again stressed importance of tobacco cessation  Cardiac risk counseling and  prevention recommendations: -recommend heart healthy/Mediterranean diet, with whole grains, fruits, vegetable, fish, lean meats, nuts, and olive oil. Limit salt. -recommend moderate walking, 3-5 times/week for 30-50 minutes each session. Aim for at least 150 minutes.week. Goal should be pace of 3 miles/hours, or walking 1.5 miles in 30 minutes -recommend avoidance of tobacco products. Avoid excess alcohol. -Additional risk factor control:  -family history: no premature CAD, but multiple family members with adult onset ASCVD -ASCVD risk score: The ASCVD Risk score [name], et al., 2013) failed to calculate for the following reasons:   The 2013 ASCVD risk score is only valid for ages 97 to 47    Plan for follow up: 3 mos (virtual ok)  Medication Adjustments/Labs and Tests Ordered: Current medicines are reviewed at length with the patient today.  Concerns regarding medicines are outlined above.  Orders Placed This Encounter  Procedures  . EKG 12-Lead   Meds ordered this encounter  Medications  . nicotine (NICOTROL) 10 MG inhaler    Sig: Inhale 1 Cartridge (1 continuous puffing total) into the lungs as needed for smoking cessation.    Dispense:  36 each    Refill:  3    Please call patient with cost prior to filling.    Patient Instructions  Medication Instructions:  Your Physician recommend you continue on your current medication as directed.    If you need a refill on your cardiac medications before your next appointment, please call your pharmacy.   Lab work: None  Testing/Procedures: None  Follow-Up: At [facility], you and your health needs are our priority.  As part of our continuing mission to provide you with exceptional heart care, we have created designated Provider Care Teams.  These Care Teams include your primary Cardiologist (physician) and Advanced Practice Providers (APPs -  Physician Assistants and Nurse Practitioners) who all work together to provide you  with the care you need, when you need it. You will need a follow up appointment in 3 months.  Please call our office 2 months in advance to schedule this appointment.  You may see Dr. Harrell Gave or one of the following Advanced Practice Providers on your designated Care Team:   Rosaria Ferries, PA-C . Jory Sims, DNP, ANP       Signed, Buford Dresser, MD PhD 01/19/2019 4:51 PM    [place]

## 2019-01-20 ENCOUNTER — Other Ambulatory Visit: Payer: Self-pay

## 2019-01-20 DIAGNOSIS — R072 Precordial pain: Secondary | ICD-10-CM

## 2019-01-20 DIAGNOSIS — Z01812 Encounter for preprocedural laboratory examination: Secondary | ICD-10-CM

## 2019-01-20 MED ORDER — NICOTINE 7 MG/24HR TD PT24: 7.0000 mg | MEDICATED_PATCH | Freq: Every day | TRANSDERMAL | 0 refills | Status: DC

## 2019-01-20 MED ORDER — METOPROLOL TARTRATE 100 MG PO TABS: ORAL_TABLET | ORAL | 0 refills | Status: DC

## 2019-01-20 MED FILL — NICOTINE 7 MG/24HR PATCH: 7 | 28 days supply | Qty: 28 | Fill #0

## 2019-01-20 MED FILL — METOPROLOL TARTRATE 100 MG: 100 | 1 days supply | Qty: 1 | Fill #0

## 2019-01-20 NOTE — Telephone Encounter (Signed)
Joseph Velasquez would like to move forward with the coronary CT. He will need 100 mg metoprolol prior. We can send the instructions via mychart. Thanks.

## 2019-01-31 MED FILL — NICOTINE 7 MG/24HR PATCH: 7 | 28 days supply | Qty: 28 | Fill #0

## 2019-01-31 MED FILL — METOPROLOL TARTRATE 100 MG: 100 | 1 days supply | Qty: 1 | Fill #0

## 2019-02-08 ENCOUNTER — Encounter: Payer: Self-pay | Admitting: Family Medicine

## 2019-02-08 ENCOUNTER — Ambulatory Visit: Payer: 59 | Admitting: Cardiology

## 2019-02-08 DIAGNOSIS — F419 Anxiety disorder, unspecified: Secondary | ICD-10-CM

## 2019-02-08 MED ORDER — PAROXETINE HCL 10 MG PO TABS: 10.0000 mg | ORAL_TABLET | Freq: Every day | ORAL | 3 refills | Status: DC

## 2019-02-08 MED FILL — PARoxetine HCL 10 MG TABS: 10 | 30 days supply | Qty: 30 | Fill #0

## 2019-02-11 ENCOUNTER — Encounter: Payer: Self-pay | Admitting: Family Medicine

## 2019-02-11 ENCOUNTER — Other Ambulatory Visit: Payer: Self-pay

## 2019-02-11 ENCOUNTER — Ambulatory Visit (INDEPENDENT_AMBULATORY_CARE_PROVIDER_SITE_OTHER): Payer: 59 | Admitting: Family Medicine

## 2019-02-11 VITALS — BP 119/80 | HR 71 | Wt 158.6 lb

## 2019-02-11 DIAGNOSIS — R911 Solitary pulmonary nodule: Secondary | ICD-10-CM | POA: Diagnosis not present

## 2019-02-11 DIAGNOSIS — F419 Anxiety disorder, unspecified: Secondary | ICD-10-CM | POA: Diagnosis not present

## 2019-02-11 MED ORDER — PAROXETINE HCL 20 MG PO TABS: 20.0000 mg | ORAL_TABLET | Freq: Every day | ORAL | 1 refills | Status: DC

## 2019-02-11 MED FILL — PARoxetine HCL 20 MG TABS: 20 | 30 days supply | Qty: 30 | Fill #0

## 2019-02-11 NOTE — Progress Notes (Deleted)
Established Patient Office Visit  Subjective:  Patient ID: Joseph Velasquez, male    DOB: Oct 02, 1985  Age: 33 y.o. MRN: [id number]  CC:  Chief Complaint  Patient presents with  . Follow-up    HPI Joseph Velasquez presents for ***  No past medical history on file.  No past surgical history on file.  No family history on file.  Social History   Socioeconomic History  . Marital status: Married    Spouse name: Not on file  . Number of children: Not on file  . Years of education: Not on file  . Highest education level: Not on file  Occupational History  . Not on file  Social Needs  . Financial resource strain: Not on file  . Food insecurity    Worry: Not on file    Inability: Not on file  . Transportation needs    Medical: Not on file    Non-medical: Not on file  Tobacco Use  . Smoking status: Current Every Day Smoker  . Smokeless tobacco: Never Used  Substance and Sexual Activity  . Alcohol use: Yes    Comment: 3 beers daily  . Drug use: Never  . Sexual activity: Not on file  Lifestyle  . Physical activity    Days per week: Not on file    Minutes per session: Not on file  . Stress: Not on file  Relationships  . Social Herbalist on phone: Not on file    Gets together: Not on file    Attends religious service: Not on file    Active member of club or organization: Not on file    Attends meetings of clubs or organizations: Not on file    Relationship status: Not on file  . Intimate partner violence    Fear of current or ex partner: Not on file    Emotionally abused: Not on file    Physically abused: Not on file    Forced sexual activity: Not on file  Other Topics Concern  . Not on file  Social History Narrative  . Not on file    Outpatient Medications Prior to Visit  Medication Sig Dispense Refill  . PARoxetine (PAXIL) 10 MG tablet Take 1 tablet (10 mg total) by mouth daily. 30 tablet 3  . metoprolol tartrate (LOPRESSOR) 100 MG tablet TAKE 1  TABLET 2 HR PRIOR TO CARDIAC PROCEDURE 1 tablet 0  . nicotine (NICODERM CQ - DOSED IN MG/24 HR) 7 mg/24hr patch Place 1 patch (7 mg total) onto the skin daily. 28 patch 0   No facility-administered medications prior to visit.     No Known Allergies  ROS Review of Systems    Objective:    Physical Exam  BP 119/80   Pulse 71   Wt 158 lb 9.6 oz (71.9 kg)   SpO2 99%   BMI 21.51 kg/m  Wt Readings from Last 3 Encounters:  02/11/19 158 lb 9.6 oz (71.9 kg)  01/19/19 161 lb 6.4 oz (73.2 kg)  01/13/19 160 lb (72.6 kg)   BP Readings from Last 3 Encounters:  02/11/19 119/80  01/19/19 126/74  01/13/19 128/80   Guideline developer:  UpToDate (see UpToDate for funding source) Date Released: June 2014  Health Maintenance Due  Topic Date Due  . HIV Screening  12/21/1999  . TETANUS/TDAP  12/21/2003  . INFLUENZA VACCINE  12/11/2018    There are no preventive care reminders to display for this patient.  Lab Results  Component Value Date   TSH 0.98 12/31/2018   Lab Results  Component Value Date   WBC 5.2 12/31/2018   HGB 15.4 12/31/2018   HCT 44.3 12/31/2018   MCV 93.5 12/31/2018   PLT 273.0 12/31/2018   Lab Results  Component Value Date   NA 137 12/31/2018   K 4.5 12/31/2018   CO2 27 12/31/2018   GLUCOSE 105 (H) 12/31/2018   BUN 8 12/31/2018   CREATININE 0.77 12/31/2018   BILITOT 0.4 12/31/2018   ALKPHOS 74 12/31/2018   AST 30 12/31/2018   ALT 39 12/31/2018   PROT 7.9 12/31/2018   ALBUMIN 5.1 12/31/2018   CALCIUM 10.0 12/31/2018   GFR 115.63 12/31/2018   Lab Results  Component Value Date   CHOL 225 (H) 12/31/2018   Lab Results  Component Value Date   HDL 55.60 12/31/2018   Lab Results  Component Value Date   LDLCALC 154 (H) 12/31/2018   Lab Results  Component Value Date   TRIG 76.0 12/31/2018   Lab Results  Component Value Date   CHOLHDL 4 12/31/2018   No results found for: HGBA1C    Assessment & Plan:   Problem List Items Addressed This  Visit    None      No orders of the defined types were placed in this encounter.   Follow-up: No follow-ups on file.

## 2019-02-11 NOTE — Progress Notes (Addendum)
Established Patient Office Visit  Subjective:  Patient ID: Joseph Velasquez, male    DOB: 03/11/1986  Age: 33 y.o. MRN: [id number]  CC:  Chief Complaint  Patient presents with  . Follow-up    HPI Joseph Velasquez presents for follow-up of his anxiety.  He did see cardiology and they spoke to him as well about smoking cessation.  They recommended a CT angiogram and he is waiting to hear about the appointment date.  He continues to exercise with his wife by walking and hiking.  Feels as though the Paxil is taken the edge off of things.  He is taking it in the morning and does not experience drowsiness.  He has been experiencing some early morning awakenings.  He awakens at 3 and sometimes has a hard time returning to sleep.  He has tried Ambien in the past and it did not work well for him.  Tylenol PM has made him groggy.  History reviewed. No pertinent past medical history.  History reviewed. No pertinent surgical history.  History reviewed. No pertinent family history.  Social History   Socioeconomic History  . Marital status: Married    Spouse name: Not on file  . Number of children: Not on file  . Years of education: Not on file  . Highest education level: Not on file  Occupational History  . Not on file  Social Needs  . Financial resource strain: Not on file  . Food insecurity    Worry: Not on file    Inability: Not on file  . Transportation needs    Medical: Not on file    Non-medical: Not on file  Tobacco Use  . Smoking status: Current Every Day Smoker  . Smokeless tobacco: Never Used  Substance and Sexual Activity  . Alcohol use: Yes    Comment: 3 beers daily  . Drug use: Never  . Sexual activity: Not on file  Lifestyle  . Physical activity    Days per week: Not on file    Minutes per session: Not on file  . Stress: Not on file  Relationships  . Social Herbalist on phone: Not on file    Gets together: Not on file    Attends religious service:  Not on file    Active member of club or organization: Not on file    Attends meetings of clubs or organizations: Not on file    Relationship status: Not on file  . Intimate partner violence    Fear of current or ex partner: Not on file    Emotionally abused: Not on file    Physically abused: Not on file    Forced sexual activity: Not on file  Other Topics Concern  . Not on file  Social History Narrative  . Not on file    Outpatient Medications Prior to Visit  Medication Sig Dispense Refill  . PARoxetine (PAXIL) 10 MG tablet Take 1 tablet (10 mg total) by mouth daily. 30 tablet 3  . metoprolol tartrate (LOPRESSOR) 100 MG tablet TAKE 1 TABLET 2 HR PRIOR TO CARDIAC PROCEDURE 1 tablet 0  . nicotine (NICODERM CQ - DOSED IN MG/24 HR) 7 mg/24hr patch Place 1 patch (7 mg total) onto the skin daily. 28 patch 0   No facility-administered medications prior to visit.     No Known Allergies  ROS Review of Systems  Constitutional: Negative.   HENT: Negative.   Respiratory: Negative.   Cardiovascular: Negative.  Gastrointestinal: Negative.   Neurological: Negative.   Psychiatric/Behavioral: Positive for dysphoric mood and sleep disturbance. The patient is nervous/anxious.        Depression screen [facility] 2/9 02/11/2019 12/30/2018  Decreased Interest 1 0  Down, Depressed, Hopeless 0 0  PHQ - 2 Score 1 0  Altered sleeping 2 2  Tired, decreased energy 1 1  Change in appetite 2 1  Feeling bad or failure about yourself  1 1  Trouble concentrating 0 0  Moving slowly or fidgety/restless 0 1  Suicidal thoughts 0 0  PHQ-9 Score 7 6    Objective:    Physical Exam  Constitutional: He is oriented to person, place, and time. He appears well-developed and well-nourished. No distress.  HENT:  Head: Normocephalic and atraumatic.  Right Ear: External ear normal.  Left Ear: External ear normal.  Eyes: Conjunctivae are normal. Right eye exhibits no discharge. Left eye exhibits no discharge. No  scleral icterus.  Neck: No JVD present. No tracheal deviation present.  Pulmonary/Chest: Effort normal. No stridor.  Neurological: He is alert and oriented to person, place, and time.  Skin: Skin is warm and dry. He is not diaphoretic.  Psychiatric: He has a normal mood and affect. His behavior is normal.    BP 119/80   Pulse 71   Wt 158 lb 9.6 oz (71.9 kg)   SpO2 99%   BMI 21.51 kg/m  Wt Readings from Last 3 Encounters:  02/11/19 158 lb 9.6 oz (71.9 kg)  01/19/19 161 lb 6.4 oz (73.2 kg)  01/13/19 160 lb (72.6 kg)     Health Maintenance Due  Topic Date Due  . HIV Screening  12/21/1999  . TETANUS/TDAP  12/21/2003    There are no preventive care reminders to display for this patient.  Lab Results  Component Value Date   TSH 0.98 12/31/2018   Lab Results  Component Value Date   WBC 5.2 12/31/2018   HGB 15.4 12/31/2018   HCT 44.3 12/31/2018   MCV 93.5 12/31/2018   PLT 273.0 12/31/2018   Lab Results  Component Value Date   NA 136 02/23/2019   K 4.5 02/23/2019   CO2 23 02/23/2019   GLUCOSE 109 (H) 02/23/2019   BUN 11 02/23/2019   CREATININE 0.77 02/23/2019   BILITOT 0.4 12/31/2018   ALKPHOS 74 12/31/2018   AST 30 12/31/2018   ALT 39 12/31/2018   PROT 7.9 12/31/2018   ALBUMIN 5.1 12/31/2018   CALCIUM 10.0 02/23/2019   GFR 115.63 12/31/2018   Lab Results  Component Value Date   CHOL 225 (H) 12/31/2018   Lab Results  Component Value Date   HDL 55.60 12/31/2018   Lab Results  Component Value Date   LDLCALC 154 (H) 12/31/2018   Lab Results  Component Value Date   TRIG 76.0 12/31/2018   Lab Results  Component Value Date   CHOLHDL 4 12/31/2018   No results found for: HGBA1C    Assessment & Plan:   Problem List Items Addressed This Visit      Other   Pulmonary nodule/lesion, solitary   Relevant Orders   CT CHEST LUNG CA SCREEN LOW DOSE W/O CM   Anxiety - Primary   Relevant Medications   PARoxetine (PAXIL) 20 MG tablet    Other Visit  Diagnoses    Solitary pulmonary nodule       Relevant Orders   CT CHEST LUNG CA SCREEN LOW DOSE W/O CM      Meds ordered  this encounter  Medications  . PARoxetine (PAXIL) 20 MG tablet    Sig: Take 1 tablet (20 mg total) by mouth daily.    Dispense:  30 tablet    Refill:  1    Follow-up: Return Return in 4-6 weeks..    We will increase Paxil to 20 mg daily.  He will experiment with taking it in the evening as opposed to the morning.  Follow-up in 4 to 6 weeks.  Libby Maw, MD

## 2019-02-23 ENCOUNTER — Telehealth (HOSPITAL_COMMUNITY): Payer: Self-pay | Admitting: Emergency Medicine

## 2019-02-23 DIAGNOSIS — Z01812 Encounter for preprocedural laboratory examination: Secondary | ICD-10-CM

## 2019-02-23 LAB — BASIC METABOLIC PANEL
BUN/Creatinine Ratio: 14 (ref 9–20)
BUN: 11 mg/dL (ref 6–20)
CO2: 23 mmol/L (ref 20–29)
Calcium: 10 mg/dL (ref 8.7–10.2)
Chloride: 98 mmol/L (ref 96–106)
Creatinine, Ser: 0.77 mg/dL (ref 0.76–1.27)
GFR calc Af Amer: 137 mL/min/{1.73_m2} (ref >59)
GFR calc non Af Amer: 118 mL/min/{1.73_m2} (ref >59)
Glucose: 109 mg/dL — ABNORMAL HIGH (ref 65–99)
Potassium: 4.5 mmol/L (ref 3.5–5.2)
Sodium: 136 mmol/L (ref 134–144)

## 2019-02-23 NOTE — Telephone Encounter (Signed)
Reaching out to patient to offer assistance regarding upcoming cardiac imaging study; pt verbalizes understanding of appt date/time, parking situation and where to check in, pre-test NPO status and medications ordered, and verified current allergies; name and call back number provided for further questions should they arise Yakelin Grenier RN Navigator Cardiac [facility] [phone] office [phone] cell 

## 2019-02-23 NOTE — Telephone Encounter (Signed)
Called pt and asked him to come in today for BMET he states that he will be in before noon to have the lab done.

## 2019-02-24 ENCOUNTER — Encounter: Payer: 59 | Admitting: *Deleted

## 2019-02-24 ENCOUNTER — Encounter (HOSPITAL_COMMUNITY): Payer: Self-pay

## 2019-02-24 ENCOUNTER — Other Ambulatory Visit: Payer: Self-pay

## 2019-02-24 ENCOUNTER — Ambulatory Visit (HOSPITAL_COMMUNITY)
Admission: RE | Admit: 2019-02-24 | Discharge: 2019-02-24 | Disposition: A | Discharge status: Home / Self Care | Payer: 59 | Source: Ambulatory Visit | Attending: Cardiology | Admitting: Cardiology

## 2019-02-24 DIAGNOSIS — Z006 Encounter for examination for normal comparison and control in clinical research program: Secondary | ICD-10-CM

## 2019-02-24 DIAGNOSIS — R072 Precordial pain: Secondary | ICD-10-CM | POA: Insufficient documentation

## 2019-02-24 MED ORDER — NITROGLYCERIN 0.4 MG SL SUBL
SUBLINGUAL_TABLET | SUBLINGUAL | Status: AC
  Filled 2019-02-24: qty 2

## 2019-02-24 MED ORDER — NITROGLYCERIN 0.4 MG SL SUBL
0.8000 mg | SUBLINGUAL_TABLET | Freq: Once | SUBLINGUAL | Status: AC
  Administered 2019-02-24: 0.8 mg via SUBLINGUAL

## 2019-02-24 MED ORDER — IOHEXOL 350 MG/ML SOLN
80.0000 mL | Freq: Once | INTRAVENOUS | Status: AC | PRN
  Administered 2019-02-24: 16:00:00 80 mL via INTRAVENOUS

## 2019-02-24 NOTE — Research (Signed)
CADFEM Informed Consent                  Subject Name:   Joseph Velasquez   Subject met inclusion and exclusion criteria.  The informed consent form, study requirements and expectations were reviewed with the subject and questions and concerns were addressed prior to the signing of the consent form.  The subject verbalized understanding of the trial requirements.  The subject agreed to participate in the CADFEM trial and signed the informed consent.  The informed consent was obtained prior to performance of any protocol-specific procedures for the subject.  A copy of the signed informed consent was given to the subject and a copy was placed in the subject's medical record.   [place] Shadana Pry, Research Assistant 02/24/2019 14:31 p.m.

## 2019-02-24 NOTE — Discharge Instructions (Signed)
Testing With IV Contrast Material °IV contrast material is a fluid that is used with some imaging tests. It is injected into your body through a vein. Contrast material is used when your health care providers need a detailed look at organs, tissues, or blood vessels that may not show up with the standard test. The material may be used when an X-ray, an MRI, a CT scan, or an ultrasound is done. °IV contrast material may be used for imaging tests that check: °· Muscles, skin, and fat. °· Breasts. °· Brain. °· Digestive tract. °· Heart. °· Organs such as the liver, kidneys, lungs, bladder, and many others. °· Arteries and veins. °Tell a health care provider about: °· Any allergies you have, especially an allergy to contrast material. °· All medicines you are taking, including metformin, beta blockers, NSAIDs (such as ibuprofen), interleukin-2, vitamins, herbs, eye drops, creams, and over-the-counter medicines. °· Any problems you or family members have had with the use of contrast material. °· Any blood disorders you have, such as sickle cell anemia. °· Any surgeries you have had. °· Any medical conditions you have or have had, especially alcohol abuse, dehydration, asthma, or kidney, liver, or heart problems. °· Whether you are pregnant or may be pregnant. °· Whether you are breastfeeding. Most contrast materials are safe for use in breastfeeding women. °What are the risks? °Generally, this is a safe procedure. However, problems may occur, including: °· Headache. °· Itching, skin rash, and hives. °· Nausea and vomiting. °· Allergic reactions. °· Wheezing or difficulty breathing. °· Abnormal heart rate. °· Changes in blood pressure. °· Throat swelling. °· Kidney damage. °What happens before the procedure? °Medicines °Ask your health care provider about: °· Changing or stopping your regular medicines. This is especially important if you are taking diabetes medicines or blood thinners. °· Taking medicines such as aspirin  and ibuprofen. These medicines can thin your blood. Do not take these medicines unless your health care provider tells you to take them. °· Taking over-the-counter medicines, vitamins, herbs, and supplements. °If you are at risk of having a reaction to the IV contrast material, you may be asked to take medicine before the procedure to prevent a reaction. °General instructions °· Follow instructions from your health care provider about eating or drinking restrictions. °· You may have an exam or lab tests to make sure that you can safely get IV contrast material. °· Ask if you will be given a medicine to help you relax (sedative) during the procedure. If so, plan to have someone take you home from the hospital or clinic. °What happens during the procedure? °· You may be given a sedative to help you relax. °· An IV will be inserted into one of your veins. °· Contrast material will be injected into your IV. °· You may feel warmth or flushing as the contrast material enters your bloodstream. °· You may have a metallic taste in your mouth for a few minutes. °· The needle may cause some discomfort and bruising. °· After the contrast material is in your body, the imaging test will be done. °The procedure may vary among health care providers and hospitals. °What can I expect after the procedure? °· The IV will be removed. °· You may be taken to a recovery area if sedation medicines were used. Your blood pressure, heart rate, breathing rate, and blood oxygen level will be monitored until you leave the hospital or clinic. °Follow these instructions at home: ° °· Take over-the-counter and   prescription medicines only as told by your health care provider. °? Your health care provider may tell you to not take certain medicines for a couple of days after the procedure. This is especially important if you are taking diabetes medicines. °· If you are told, drink enough fluid to keep your urine pale yellow. This will help to remove  the contrast material out of your body. °· Do not drive for 24 hours if you were given a sedative during your procedure. °· It is up to you to get the results of your procedure. Ask your health care provider, or the department that is doing the procedure, when your results will be ready. °· Keep all follow-up visits as told by your health care provider. This is important. °Contact a health care provider if: °· You have redness, swelling, or pain near your IV site. °Get help right away if: °· You have an abnormal heart rhythm. °· You have trouble breathing. °· You have: °? Chest pain. °? Pain in your back, neck, arm, jaw, or stomach. °? Nausea or sweating. °? Hives or a rash. °· You start shaking and cannot stop. °These symptoms may represent a serious problem that is an emergency. Do not wait to see if the symptoms will go away. Get medical help right away. Call your local emergency services (911 in the [place] Do not drive yourself to the hospital. °Summary °· IV contrast material may be used for imaging tests to help your health care providers see your organs and tissues more clearly. °· Tell your health care provider if you are pregnant or may be pregnant. °· During the procedure, you may feel warmth or flushing as the contrast material enters your bloodstream. °· After the procedure, drink enough fluid to keep your urine pale yellow. °This information is not intended to replace advice given to you by your health care provider. Make sure you discuss any questions you have with your health care provider. °Document Released: 04/16/2009 Document Revised: 07/15/2018 Document Reviewed: 07/15/2018 °Elsevier Patient Education © 2020 [employer] ° ° °Cardiac CT Angiogram ° °A cardiac CT angiogram is a procedure to look at the heart and the area around the heart. It may be done to help find the cause of chest pains or other symptoms of heart disease. During this procedure, a large X-ray machine, called a CT scanner,  takes detailed pictures of the heart and the surrounding area after a dye (contrast material) has been injected into blood vessels in the area. The procedure is also sometimes called a coronary CT angiogram, coronary artery scanning, or CTA. °A cardiac CT angiogram allows the health care provider to see how well blood is flowing to and from the heart. The health care provider will be able to see if there are any problems, such as: °· Blockage or narrowing of the coronary arteries in the heart. °· Fluid around the heart. °· Signs of weakness or disease in the muscles, valves, and tissues of the heart. °Tell a health care provider about: °· Any allergies you have. This is especially important if you have had a previous allergic reaction to contrast dye. °· All medicines you are taking, including vitamins, herbs, eye drops, creams, and over-the-counter medicines. °· Any blood disorders you have. °· Any surgeries you have had. °· Any medical conditions you have. °· Whether you are pregnant or may be pregnant. °· Any anxiety disorders, chronic pain, or other conditions you have that may increase your stress or prevent   you from lying still. °What are the risks? °Generally, this is a safe procedure. However, problems may occur, including: °· Bleeding. °· Infection. °· Allergic reactions to medicines or dyes. °· Damage to other structures or organs. °· Kidney damage from the dye or contrast that is used. °· Increased risk of cancer from radiation exposure. This risk is low. Talk with your health care provider about: °? The risks and benefits of testing. °? How you can receive the lowest dose of radiation. °What happens before the procedure? °· Wear comfortable clothing and remove any jewelry, glasses, dentures, and hearing aids. °· Follow instructions from your health care provider about eating and drinking. This may include: °? For 12 hours before the test -- avoid caffeine. This includes tea, coffee, soda, energy drinks,  and diet pills. Drink plenty of water or other fluids that do not have caffeine in them. Being well-hydrated can prevent complications. °? For 4-6 hours before the test -- stop eating and drinking. The contrast dye can cause nausea, but this is less likely if your stomach is empty. °· Ask your health care provider about changing or stopping your regular medicines. This is especially important if you are taking diabetes medicines, blood thinners, or medicines to treat erectile dysfunction. °What happens during the procedure? °· Hair on your chest may need to be removed so that small sticky patches called electrodes can be placed on your chest. These will transmit information that helps to monitor your heart during the test. °· An IV tube will be inserted into one of your veins. °· You might be given a medicine to control your heart rate during the test. This will help to ensure that good images are obtained. °· You will be asked to lie on an exam table. This table will slide in and out of the CT machine during the procedure. °· Contrast dye will be injected into the IV tube. You might feel warm, or you may get a metallic taste in your mouth. °· You will be given a medicine (nitroglycerin) to relax (dilate) the arteries in your heart. °· The table that you are lying on will move into the CT machine tunnel for the scan. °· The person running the machine will give you instructions while the scans are being done. You may be asked to: °? Keep your arms above your head. °? Hold your breath. °? Stay very still, even if the table is moving. °· When the scanning is complete, you will be moved out of the machine. °· The IV tube will be removed. °The procedure may vary among health care providers and hospitals. °What happens after the procedure? °· You might feel warm, or you may get a metallic taste in your mouth from the contrast dye. °· You may have a headache from the nitroglycerin. °· After the procedure, drink water or  other fluids to wash (flush) the contrast material out of your body. °· Contact a health care provider if you have any symptoms of allergy to the contrast. These symptoms include: °? Shortness of breath. °? Rash or hives. °? A racing heartbeat. °· Most people can return to their normal activities right after the procedure. Ask your health care provider what activities are safe for you. °· It is up to you to get the results of your procedure. Ask your health care provider, or the department that is doing the procedure, when your results will be ready. °Summary °· A cardiac CT angiogram is a procedure to   look at the heart and the area around the heart. It may be done to help find the cause of chest pains or other symptoms of heart disease. °· During this procedure, a large X-ray machine, called a CT scanner, takes detailed pictures of the heart and the surrounding area after a dye (contrast material) has been injected into blood vessels in the area. °· Ask your health care provider about changing or stopping your regular medicines before the procedure. This is especially important if you are taking diabetes medicines, blood thinners, or medicines to treat erectile dysfunction. °· After the procedure, drink water or other fluids to wash (flush) the contrast material out of your body. °This information is not intended to replace advice given to you by your health care provider. Make sure you discuss any questions you have with your health care provider. °Document Released: 04/10/2008 Document Revised: 04/10/2017 Document Reviewed: 03/17/2016 °Elsevier Patient Education © 2020 [employer] ° °

## 2019-02-24 NOTE — Progress Notes (Signed)
Pt tolerated procedure without incident.  PIV removed, dressing applied.  Beverage and snack provided to patient post exam.  Discharge instructions discussed, questions answered,.  Pt discharge

## 2019-03-03 ENCOUNTER — Encounter: Payer: Self-pay | Admitting: Family Medicine

## 2019-03-04 DIAGNOSIS — F419 Anxiety disorder, unspecified: Secondary | ICD-10-CM | POA: Insufficient documentation

## 2019-03-04 DIAGNOSIS — R911 Solitary pulmonary nodule: Secondary | ICD-10-CM | POA: Insufficient documentation

## 2019-03-04 DIAGNOSIS — F4322 Adjustment disorder with anxiety: Secondary | ICD-10-CM | POA: Insufficient documentation

## 2019-03-04 DIAGNOSIS — Z9189 Other specified personal risk factors, not elsewhere classified: Secondary | ICD-10-CM | POA: Insufficient documentation

## 2019-03-04 NOTE — Addendum Note (Signed)
Addended by: Abelino Derrick A on: 03/04/2019 09:00 AM   Modules accepted: Orders

## 2019-03-25 ENCOUNTER — Encounter: Payer: Self-pay | Admitting: Family Medicine

## 2019-03-25 ENCOUNTER — Ambulatory Visit (INDEPENDENT_AMBULATORY_CARE_PROVIDER_SITE_OTHER): Payer: 59 | Admitting: Family Medicine

## 2019-03-25 ENCOUNTER — Other Ambulatory Visit: Payer: Self-pay

## 2019-03-25 VITALS — BP 130/76 | HR 87 | Ht 72.0 in | Wt 160.0 lb

## 2019-03-25 DIAGNOSIS — Z9189 Other specified personal risk factors, not elsewhere classified: Secondary | ICD-10-CM | POA: Diagnosis not present

## 2019-03-25 DIAGNOSIS — R911 Solitary pulmonary nodule: Secondary | ICD-10-CM | POA: Diagnosis not present

## 2019-03-25 DIAGNOSIS — F419 Anxiety disorder, unspecified: Secondary | ICD-10-CM

## 2019-03-25 MED ORDER — PAROXETINE HCL 30 MG PO TABS: 30.0000 mg | ORAL_TABLET | Freq: Every day | ORAL | 1 refills | Status: DC

## 2019-03-25 MED FILL — PARoxetine HCL 30 MG TABS: 30 | 30 days supply | Qty: 30 | Fill #0

## 2019-03-25 NOTE — Progress Notes (Signed)
Established Patient Office Visit  Subjective:  Patient ID: Joseph Velasquez, male    DOB: [birth year]  Age: 33 y.o. MRN: [id number]  CC:  Chief Complaint  Patient presents with  . Follow-up    HPI Joseph Velasquez presents for follow-up of his anxiety.  Feels as though the Paxil is helping somewhat but states sleep quality remains poor, overall.  Happy to have a negative CT angiogram of his heart.  Scan did show an isolated pulmonary nodule in the left lower lobe measuring 6 mm.  Patient did not tolerate the NicoDerm.  He and his wife quit cold [place] together.  They made it 2 weeks but unfortunately started to smoke again.  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  History reviewed. No pertinent past medical history.  History reviewed. No pertinent surgical history.  History reviewed. No pertinent family history.  Social History   Socioeconomic History  . Marital status: Married    Spouse name: Not on file  . Number of children: Not on file  . Years of education: Not on file  . Highest education level: Not on file  Occupational History  . Not on file  Social Needs  . Financial resource strain: Not on file  . Food insecurity    Worry: Not on file    Inability: Not on file  . Transportation needs    Medical: Not on file    Non-medical: Not on file  Tobacco Use  . Smoking status: Current Every Day Smoker  . Smokeless tobacco: Never Used  Substance and Sexual Activity  . Alcohol use: Yes    Comment: 3 beers daily  . Drug use: Never  . Sexual activity: Not on file  Lifestyle  . Physical activity    Days per week: Not on file    Minutes per session: Not on file  . Stress: Not on file  Relationships  . Social Herbalist on phone: Not on file    Gets together: Not on file    Attends religious service: Not on file    Active member of club or organization: Not on file    Attends meetings of clubs or organizations: Not  on file    Relationship status: Not on file  . Intimate partner violence    Fear of current or ex partner: Not on file    Emotionally abused: Not on file    Physically abused: Not on file    Forced sexual activity: Not on file  Other Topics Concern  . Not on file  Social History Narrative  . Not on file    Outpatient Medications Prior to Visit  Medication Sig Dispense Refill  . nicotine (NICODERM CQ - DOSED IN MG/24 HR) 7 mg/24hr patch Place 1 patch (7 mg total) onto the skin daily. 28 patch 0  . PARoxetine (PAXIL) 20 MG tablet Take 1 tablet (20 mg total) by mouth daily. 30 tablet 1  . metoprolol tartrate (LOPRESSOR) 100 MG tablet TAKE 1 TABLET 2 HR PRIOR TO CARDIAC PROCEDURE 1 tablet 0   No facility-administered medications prior to visit.     No Known Allergies  ROS Review of Systems  Constitutional: Negative.   Respiratory: Negative.   Cardiovascular: Negative.   Gastrointestinal: Negative.   Endocrine: Negative for polyphagia and polyuria.  Genitourinary: Negative.   Psychiatric/Behavioral: Positive for sleep disturbance. The patient is nervous/anxious.    Depression  screen [facility] 2/9 03/25/2019 02/11/2019 12/30/2018  Decreased Interest 1 1 0  Down, Depressed, Hopeless 0 0 0  PHQ - 2 Score 1 1 0  Altered sleeping 2 2 2   Tired, decreased energy 2 1 1   Change in appetite 1 2 1   Feeling bad or failure about yourself  0 1 1  Trouble concentrating 0 0 0  Moving slowly or fidgety/restless 0 0 1  Suicidal thoughts 0 0 0  PHQ-9 Score 6 7 6       Objective:    Physical Exam  BP 130/76   Pulse 87   Ht 6' (1.829 m)   Wt 160 lb (72.6 kg)   SpO2 98%   BMI 21.70 kg/m  Wt Readings from Last 3 Encounters:  03/25/19 160 lb (72.6 kg)  02/11/19 158 lb 9.6 oz (71.9 kg)  01/19/19 161 lb 6.4 oz (73.2 kg)   BP Readings from Last 3 Encounters:  03/25/19 130/76  02/24/19 128/73  02/11/19 119/80   Guideline developer:  UpToDate (see UpToDate for funding source) Date  Released: June 2014  Health Maintenance Due  Topic Date Due  . HIV Screening  12/21/1999  . TETANUS/TDAP  12/21/2003    There are no preventive care reminders to display for this patient.  Lab Results  Component Value Date   TSH 0.98 12/31/2018   Lab Results  Component Value Date   WBC 5.2 12/31/2018   HGB 15.4 12/31/2018   HCT 44.3 12/31/2018   MCV 93.5 12/31/2018   PLT 273.0 12/31/2018   Lab Results  Component Value Date   NA 136 02/23/2019   K 4.5 02/23/2019   CO2 23 02/23/2019   GLUCOSE 109 (H) 02/23/2019   BUN 11 02/23/2019   CREATININE 0.77 02/23/2019   BILITOT 0.4 12/31/2018   ALKPHOS 74 12/31/2018   AST 30 12/31/2018   ALT 39 12/31/2018   PROT 7.9 12/31/2018   ALBUMIN 5.1 12/31/2018   CALCIUM 10.0 02/23/2019   GFR 115.63 12/31/2018   Lab Results  Component Value Date   CHOL 225 (H) 12/31/2018   Lab Results  Component Value Date   HDL 55.60 12/31/2018   Lab Results  Component Value Date   LDLCALC 154 (H) 12/31/2018   Lab Results  Component Value Date   TRIG 76.0 12/31/2018   Lab Results  Component Value Date   CHOLHDL 4 12/31/2018   No results found for: HGBA1C    Assessment & Plan:   Problem List Items Addressed This Visit      Other   Anxiety - Primary   Relevant Medications   PARoxetine (PAXIL) 30 MG tablet      Meds ordered this encounter  Medications  . PARoxetine (PAXIL) 30 MG tablet    Sig: Take 1 tablet (30 mg total) by mouth daily.    Dispense:  30 tablet    Refill:  1    Follow-up: Return return in 4-8 weeks.   Have increased Paxil to 30 mg daily.  Hopefully this will help.  Have considered adding Remeron or switching to a different class.  Follow-up CT of the pulmonary nodule has been ordered for 1 year.  Follow-up will be in 4 to 8 weeks.  Patient along with his wife will try to quit again soon.

## 2019-04-14 ENCOUNTER — Encounter: Payer: Self-pay | Admitting: Family Medicine

## 2019-04-14 NOTE — Telephone Encounter (Signed)
Can we change Mr. Ree upcoming visit to virtual? Date/time can remain the same. Thanks!

## 2019-04-20 ENCOUNTER — Encounter: Payer: Self-pay | Admitting: Cardiology

## 2019-04-20 ENCOUNTER — Telehealth (INDEPENDENT_AMBULATORY_CARE_PROVIDER_SITE_OTHER): Payer: 59 | Admitting: Cardiology

## 2019-04-20 VITALS — Ht 72.0 in | Wt 160.0 lb

## 2019-04-20 DIAGNOSIS — Z720 Tobacco use: Secondary | ICD-10-CM

## 2019-04-20 DIAGNOSIS — I493 Ventricular premature depolarization: Secondary | ICD-10-CM

## 2019-04-20 DIAGNOSIS — Z7189 Other specified counseling: Secondary | ICD-10-CM | POA: Diagnosis not present

## 2019-04-20 DIAGNOSIS — Z712 Person consulting for explanation of examination or test findings: Secondary | ICD-10-CM

## 2019-04-20 DIAGNOSIS — Z716 Tobacco abuse counseling: Secondary | ICD-10-CM

## 2019-04-20 NOTE — Progress Notes (Signed)
Virtual Visit via Telephone Note   This visit type was conducted due to national recommendations for restrictions regarding the COVID-19 Pandemic (e.g. social distancing) in an effort to limit this patient's exposure and mitigate transmission in our community.  Due to his co-morbid illnesses, this patient is at least at moderate risk for complications without adequate follow up.  This format is felt to be most appropriate for this patient at this time.  The patient did not have access to video technology/had technical difficulties with video requiring transitioning to audio format only (telephone).  All issues noted in this document were discussed and addressed.  No physical exam could be performed with this format.  Please refer to the patient's chart for his  consent to telehealth for [facility]   Date:  04/20/2019   ID:  Joseph Velasquez, DOB [birth year], MRN [id number]  Patient Location: Home Provider Location: Office  PCP:  Libby Maw, MD  Cardiologist:  Buford Dresser, MD  Electrophysiologist:  None   Evaluation Performed:  Follow-Up Visit  Chief Complaint:  Follow up visit  History of Present Illness:    Joseph Velasquez is a 33 y.o. male with PMH PVCs who was seen for initial consult on 01/19/19.  The patient does not have symptoms concerning for COVID-19 infection (fever, chills, cough, or new shortness of breath).   Today: reviewed results of his CCTA, which showed excellent coronary anatomy and no CAD. PFO, which we discussed is a normal variant. He was reassured by this. Palpitations/pain have been much improved since our visit. Asking if the paxil can do this, discussed role of nervous system in sensation translation and that this may be beneficial when symptoms are non-cardiac.  He was able to quit smoking cold [place] for 3-4 weeks, but his wife unfortunately could not quit at the same time, and he relapsed to smoking. He is very motivated to try tobacco  cessation again. Discussed the importance of this with him.   No past medical history on file. No past surgical history on file.   Current Meds  Medication Sig  . PARoxetine (PAXIL) 30 MG tablet Take 1 tablet (30 mg total) by mouth daily.  . [DISCONTINUED] nicotine (NICODERM CQ - DOSED IN MG/24 HR) 7 mg/24hr patch Place 1 patch (7 mg total) onto the skin daily.     Allergies:   Patient has no known allergies.   Social History   Tobacco Use  . Smoking status: Current Every Day Smoker  . Smokeless tobacco: Never Used  Substance Use Topics  . Alcohol use: Yes    Comment: 3 beers daily  . Drug use: Never     Family Hx: The patient's family history is not on file.  ROS:   Please see the history of present illness.    All other systems reviewed and are negative.   Prior CV studies:   The following studies were reviewed today: CCTA reviewed together  Labs/Other Tests and Data Reviewed:    EKG:  An ECG dated 01/19/19 was personally reviewed today and demonstrated:  NSR  Recent Labs: 12/31/2018: ALT 39; Hemoglobin 15.4; Platelets 273.0; TSH 0.98 02/23/2019: BUN 11; Creatinine, Ser 0.77; Potassium 4.5; Sodium 136   Recent Lipid Panel Lab Results  Component Value Date/Time   CHOL 225 (H) 12/31/2018 08:09 AM   TRIG 76.0 12/31/2018 08:09 AM   HDL 55.60 12/31/2018 08:09 AM   CHOLHDL 4 12/31/2018 08:09 AM   LDLCALC 154 (H) 12/31/2018 08:09 AM  LDLDIRECT 137.0 12/31/2018 08:09 AM    Wt Readings from Last 3 Encounters:  04/20/19 160 lb (72.6 kg)  03/25/19 160 lb (72.6 kg)  02/11/19 158 lb 9.6 oz (71.9 kg)     Objective:    Vital Signs:  Ht 6' (1.829 m)   Wt 160 lb (72.6 kg)   BMI 21.70 kg/m   Speaking comfortably on the phone, no audible wheezing In no acute distress Alert and oriented Normal affect Normal speech  ASSESSMENT & PLAN:    Reviewed results of CCTA: normal, excellent coronary vessels without blockages. PFO, normal variant  Tobacco cessation: The  patient was counseled on tobacco cessation today for 3 minutes.  Counseling included reviewing the risks of smoking tobacco products, how it impacts the patient's current medical diagnoses and different strategies for quitting.  Pharmacotherapy to aid in tobacco cessation was not prescribed today.  COVID-19 Education: The signs and symptoms of COVID-19 were discussed with the patient and how to seek care for testing (follow up with PCP or arrange E-visit).  The importance of social distancing was discussed today.  Time:   Today, I have spent 15 minutes with the patient with telehealth technology discussing the above problems.     Medication Adjustments/Labs and Tests Ordered: Current medicines are reviewed at length with the patient today.  Concerns regarding medicines are outlined above.   Tests Ordered: No orders of the defined types were placed in this encounter.   Medication Changes: No orders of the defined types were placed in this encounter.   Follow Up:  PRN  Signed, Buford Dresser, MD  04/20/2019 6:25 PM    [place]

## 2019-04-20 NOTE — Patient Instructions (Signed)
Medication Instructions:  Your Physician recommend you continue on your current medication as directed.    *If you need a refill on your cardiac medications before your next appointment, please call your pharmacy*  Lab Work: None  Testing/Procedures: None  Follow-Up: At [facility], you and your health needs are our priority.  As part of our continuing mission to provide you with exceptional heart care, we have created designated Provider Care Teams.  These Care Teams include your primary Cardiologist (physician) and Advanced Practice Providers (APPs -  Physician Assistants and Nurse Practitioners) who all work together to provide you with the care you need, when you need it.  Your next appointment:   As needed  The format for your next appointment:   Either In Person or Virtual  Provider:   Buford Dresser, MD

## 2019-04-22 ENCOUNTER — Encounter: Payer: Self-pay | Admitting: Family Medicine

## 2019-04-22 ENCOUNTER — Telehealth (INDEPENDENT_AMBULATORY_CARE_PROVIDER_SITE_OTHER): Payer: 59 | Admitting: Family Medicine

## 2019-04-22 DIAGNOSIS — F419 Anxiety disorder, unspecified: Secondary | ICD-10-CM

## 2019-04-22 MED ORDER — PAROXETINE HCL 30 MG PO TABS: 30.0000 mg | ORAL_TABLET | Freq: Every day | ORAL | 0 refills | Status: DC

## 2019-04-22 MED FILL — PARoxetine HCL 30 MG TABS: 30 | 90 days supply | Qty: 90 | Fill #0

## 2019-04-22 NOTE — Progress Notes (Signed)
Established Patient Office Visit  Subjective:  Patient ID: Joseph Velasquez, male    DOB: 1986/05/11  Age: 33 y.o. MRN: [id number]  CC: No chief complaint on file.   HPI Joseph Velasquez presents for follow-up of his anxiety after increasing the Paxil to 30 mg.  He believes it is starting to have a positive effect.  Definitely less anxiety and some elevation in the mood.  Wife is noted that he tends to ruminate less.  He is sleeping well.  He is okay with continuing it for now.  Continues to get at least 10,000 steps between work and home exercise.  They are currently fixing up their house there she is rocking area.  Continues to smoke some but has cut back significantly.  They will stay at home for the holidays.  History reviewed. No pertinent past medical history.  History reviewed. No pertinent surgical history.  History reviewed. No pertinent family history.  Social History   Socioeconomic History  . Marital status: Married    Spouse name: Not on file  . Number of children: Not on file  . Years of education: Not on file  . Highest education level: Not on file  Occupational History  . Not on file  Tobacco Use  . Smoking status: Current Every Day Smoker  . Smokeless tobacco: Never Used  Substance and Sexual Activity  . Alcohol use: Yes    Comment: 3 beers daily  . Drug use: Never  . Sexual activity: Not on file  Other Topics Concern  . Not on file  Social History Narrative  . Not on file   Social Determinants of Health   Financial Resource Strain:   . Difficulty of Paying Living Expenses: Not on file  Food Insecurity:   . Worried About Charity fundraiser in the Last Year: Not on file  . Ran Out of Food in the Last Year: Not on file  Transportation Needs:   . Lack of Transportation (Medical): Not on file  . Lack of Transportation (Non-Medical): Not on file  Physical Activity:   . Days of Exercise per Week: Not on file  . Minutes of Exercise per Session: Not on  file  Stress:   . Feeling of Stress : Not on file  Social Connections:   . Frequency of Communication with Friends and Family: Not on file  . Frequency of Social Gatherings with Friends and Family: Not on file  . Attends Religious Services: Not on file  . Active Member of Clubs or Organizations: Not on file  . Attends Archivist Meetings: Not on file  . Marital Status: Not on file  Intimate Partner Violence:   . Fear of Current or Ex-Partner: Not on file  . Emotionally Abused: Not on file  . Physically Abused: Not on file  . Sexually Abused: Not on file    Outpatient Medications Prior to Visit  Medication Sig Dispense Refill  . PARoxetine (PAXIL) 30 MG tablet Take 1 tablet (30 mg total) by mouth daily. 30 tablet 1   No facility-administered medications prior to visit.    No Known Allergies  ROS Review of Systems  Constitutional: Negative.   Respiratory: Negative.   Cardiovascular: Negative.   Gastrointestinal: Negative.   Psychiatric/Behavioral: Negative for sleep disturbance. The patient is not nervous/anxious.       Objective:    Physical Exam  Constitutional: He is oriented to person, place, and time. He appears well-developed and well-nourished. No distress.  HENT:  Head: Normocephalic and atraumatic.  Right Ear: External ear normal.  Left Ear: External ear normal.  Eyes: Right eye exhibits no discharge. Left eye exhibits no discharge. No scleral icterus.  Neck: No JVD present. No tracheal deviation present.  Pulmonary/Chest: Effort normal. No stridor.  Neurological: He is alert and oriented to person, place, and time.  Skin: He is not diaphoretic.  Psychiatric: He has a normal mood and affect. His behavior is normal.    There were no vitals taken for this visit. Wt Readings from Last 3 Encounters:  04/20/19 160 lb (72.6 kg)  03/25/19 160 lb (72.6 kg)  02/11/19 158 lb 9.6 oz (71.9 kg)     Health Maintenance Due  Topic Date Due  . HIV  Screening  12/21/1999  . TETANUS/TDAP  12/21/2003    There are no preventive care reminders to display for this patient.  Lab Results  Component Value Date   TSH 0.98 12/31/2018   Lab Results  Component Value Date   WBC 5.2 12/31/2018   HGB 15.4 12/31/2018   HCT 44.3 12/31/2018   MCV 93.5 12/31/2018   PLT 273.0 12/31/2018   Lab Results  Component Value Date   NA 136 02/23/2019   K 4.5 02/23/2019   CO2 23 02/23/2019   GLUCOSE 109 (H) 02/23/2019   BUN 11 02/23/2019   CREATININE 0.77 02/23/2019   BILITOT 0.4 12/31/2018   ALKPHOS 74 12/31/2018   AST 30 12/31/2018   ALT 39 12/31/2018   PROT 7.9 12/31/2018   ALBUMIN 5.1 12/31/2018   CALCIUM 10.0 02/23/2019   GFR 115.63 12/31/2018   Lab Results  Component Value Date   CHOL 225 (H) 12/31/2018   Lab Results  Component Value Date   HDL 55.60 12/31/2018   Lab Results  Component Value Date   LDLCALC 154 (H) 12/31/2018   Lab Results  Component Value Date   TRIG 76.0 12/31/2018   Lab Results  Component Value Date   CHOLHDL 4 12/31/2018   No results found for: HGBA1C    Assessment & Plan:   Problem List Items Addressed This Visit      Other   Anxiety - Primary   Relevant Medications   PARoxetine (PAXIL) 30 MG tablet      Meds ordered this encounter  Medications  . PARoxetine (PAXIL) 30 MG tablet    Sig: Take 1 tablet (30 mg total) by mouth daily.    Dispense:  90 tablet    Refill:  0    Follow-up: Return 2-3 months.Libby Maw, MD   Virtual Visit via Video Note  I connected with Joseph Velasquez on 04/22/19 at  1:30 PM EST by a video enabled telemedicine application and verified that I am speaking with the correct person using two identifiers.  Location: Patient:home alone Provider:    I discussed the limitations of evaluation and management by telemedicine and the availability of in person appointments. The patient expressed understanding and agreed to proceed.  History of  Present Illness:    Observations/Objective:   Assessment and Plan:   Follow Up Instructions:    I discussed the assessment and treatment plan with the patient. The patient was provided an opportunity to ask questions and all were answered. The patient agreed with the plan and demonstrated an understanding of the instructions.   The patient was advised to call back or seek an in-person evaluation if the symptoms worsen or if the condition fails  to improve as anticipated.  I provided 20 minutes of non-face-to-face time during this encounter.   Libby Maw, MD

## 2019-07-10 DIAGNOSIS — S60221A Contusion of right hand, initial encounter: Secondary | ICD-10-CM | POA: Diagnosis not present

## 2019-07-19 ENCOUNTER — Other Ambulatory Visit: Payer: Self-pay | Admitting: Family Medicine

## 2019-07-19 DIAGNOSIS — F419 Anxiety disorder, unspecified: Secondary | ICD-10-CM

## 2019-07-20 MED ORDER — PAROXETINE HCL 30 MG PO TABS: ORAL_TABLET | ORAL | 0 refills | Status: DC

## 2019-07-20 MED FILL — PARoxetine HCL 30 MG TABS: 30 | 30 days supply | Qty: 30 | Fill #0

## 2019-08-19 ENCOUNTER — Other Ambulatory Visit: Payer: Self-pay | Admitting: Family Medicine

## 2019-08-19 DIAGNOSIS — F419 Anxiety disorder, unspecified: Secondary | ICD-10-CM

## 2019-08-23 ENCOUNTER — Other Ambulatory Visit: Payer: Self-pay | Admitting: Family Medicine

## 2019-08-23 DIAGNOSIS — F419 Anxiety disorder, unspecified: Secondary | ICD-10-CM

## 2019-08-23 MED FILL — PARoxetine HCL 30 MG TABS: 30 | 30 days supply | Qty: 30 | Fill #0

## 2019-09-21 ENCOUNTER — Other Ambulatory Visit: Payer: Self-pay | Admitting: Family Medicine

## 2019-09-21 DIAGNOSIS — F419 Anxiety disorder, unspecified: Secondary | ICD-10-CM

## 2019-09-21 NOTE — Telephone Encounter (Signed)
Called patient unable to leave message, sent message via Mychart asking if patient could give [place] a call to schedule an appointment for follow up on medications.

## 2019-09-22 ENCOUNTER — Encounter: Payer: Self-pay | Admitting: Family Medicine

## 2019-09-22 ENCOUNTER — Other Ambulatory Visit: Payer: Self-pay

## 2019-09-22 ENCOUNTER — Ambulatory Visit: Payer: 59 | Admitting: Family Medicine

## 2019-09-22 VITALS — BP 122/70 | HR 95 | Temp 97.8°F | Ht 72.0 in | Wt 163.2 lb

## 2019-09-22 DIAGNOSIS — Z720 Tobacco use: Secondary | ICD-10-CM | POA: Diagnosis not present

## 2019-09-22 DIAGNOSIS — F418 Other specified anxiety disorders: Secondary | ICD-10-CM | POA: Diagnosis not present

## 2019-09-22 DIAGNOSIS — E7800 Pure hypercholesterolemia, unspecified: Secondary | ICD-10-CM | POA: Diagnosis not present

## 2019-09-22 MED ORDER — PAROXETINE HCL 30 MG PO TABS: ORAL_TABLET | ORAL | 1 refills | Status: DC

## 2019-09-22 NOTE — Telephone Encounter (Signed)
Must sched appt first per last fill/thx dmf

## 2019-09-22 NOTE — Patient Instructions (Addendum)
Steps to Quit Smoking Smoking tobacco is the leading cause of preventable death. It can affect almost every organ in the body. Smoking puts you and people around you at risk for many serious, long-lasting (chronic) diseases. Quitting smoking can be hard, but it is one of the best things that you can do for your health. It is never too late to quit. How do I get ready to quit? When you decide to quit smoking, make a plan to help you succeed. Before you quit:  Pick a date to quit. Set a date within the next 2 weeks to give you time to prepare.  Write down the reasons why you are quitting. Keep this list in places where you will see it often.  Tell your family, friends, and co-workers that you are quitting. Their support is important.  Talk with your doctor about the choices that may help you quit.  Find out if your health insurance will pay for these treatments.  Know the people, places, things, and activities that make you want to smoke (triggers). Avoid them. What first steps can I take to quit smoking?  Throw away all cigarettes at home, at work, and in your car.  Throw away the things that you use when you smoke, such as ashtrays and lighters.  Clean your car. Make sure to empty the ashtray.  Clean your home, including curtains and carpets. What can I do to help me quit smoking? Talk with your doctor about taking medicines and seeing a counselor at the same time. You are more likely to succeed when you do both.  If you are pregnant or breastfeeding, talk with your doctor about counseling or other ways to quit smoking. Do not take medicine to help you quit smoking unless your doctor tells you to do so. To quit smoking: Quit right away  Quit smoking totally, instead of slowly cutting back on how much you smoke over a period of time.  Go to counseling. You are more likely to quit if you go to counseling sessions regularly. Take medicine You may take medicines to help you quit. Some  medicines need a prescription, and some you can buy over-the-counter. Some medicines may contain a drug called nicotine to replace the nicotine in cigarettes. Medicines may:  Help you to stop having the desire to smoke (cravings).  Help to stop the problems that come when you stop smoking (withdrawal symptoms). Your doctor may ask you to use:  Nicotine patches, gum, or lozenges.  Nicotine inhalers or sprays.  Non-nicotine medicine that is taken by mouth. Find resources Find resources and other ways to help you quit smoking and remain smoke-free after you quit. These resources are most helpful when you use them often. They include:  Online chats with a counselor.  Phone quitlines.  Printed self-help materials.  Support groups or group counseling.  Text messaging programs.  Mobile phone apps. Use apps on your mobile phone or tablet that can help you stick to your quit plan. There are many free apps for mobile phones and tablets as well as websites. Examples include Quit Guide from the CDC and smokefree.gov  What things can I do to make it easier to quit?   Talk to your family and friends. Ask them to support and encourage you.  Call a phone quitline (1-800-QUIT-NOW), reach out to support groups, or work with a counselor.  Ask people who smoke to not smoke around you.  Avoid places that make you want to smoke,   such as: ? Bars. ? Parties. ? Smoke-break areas at work.  Spend time with people who do not smoke.  Lower the stress in your life. Stress can make you want to smoke. Try these things to help your stress: ? Getting regular exercise. ? Doing deep-breathing exercises. ? Doing yoga. ? Meditating. ? Doing a body scan. To do this, close your eyes, focus on one area of your body at a time from head to toe. Notice which parts of your body are tense. Try to relax the muscles in those areas. How will I feel when I quit smoking? Day 1 to 3 weeks Within the first 24 hours,  you may start to have some problems that come from quitting tobacco. These problems are very bad 2-3 days after you quit, but they do not often last for more than 2-3 weeks. You may get these symptoms:  Mood swings.  Feeling restless, nervous, angry, or annoyed.  Trouble concentrating.  Dizziness.  Strong desire for high-sugar foods and nicotine.  Weight gain.  Trouble pooping (constipation).  Feeling like you may vomit (nausea).  Coughing or a sore throat.  Changes in how the medicines that you take for other issues work in your body.  Depression.  Trouble sleeping (insomnia). Week 3 and afterward After the first 2-3 weeks of quitting, you may start to notice more positive results, such as:  Better sense of smell and taste.  Less coughing and sore throat.  Slower heart rate.  Lower blood pressure.  Clearer skin.  Better breathing.  Fewer sick days. Quitting smoking can be hard. Do not give up if you fail the first time. Some people need to try a few times before they succeed. Do your best to stick to your quit plan, and talk with your doctor if you have any questions or concerns. Summary  Smoking tobacco is the leading cause of preventable death. Quitting smoking can be hard, but it is one of the best things that you can do for your health.  When you decide to quit smoking, make a plan to help you succeed.  Quit smoking right away, not slowly over a period of time.  When you start quitting, seek help from your doctor, family, or friends. This information is not intended to replace advice given to you by your health care provider. Make sure you discuss any questions you have with your health care provider. Document Revised: 01/21/2019 Document Reviewed: 07/17/2018 Elsevier Patient Education  [place], Adult After being diagnosed with an anxiety disorder, you may be relieved to know why you have felt or behaved a certain way. You may  also feel overwhelmed about the treatment ahead and what it will mean for your life. With care and support, you can manage this condition and recover from it. How to manage lifestyle changes Managing stress and anxiety  Stress is your body's reaction to life changes and events, both good and bad. Most stress will last just a few hours, but stress can be ongoing and can lead to more than just stress. Although stress can play a major role in anxiety, it is not the same as anxiety. Stress is usually caused by something external, such as a deadline, test, or competition. Stress normally passes after the triggering event has ended.  Anxiety is caused by something internal, such as imagining a terrible outcome or worrying that something will go wrong that will devastate you. Anxiety often does not go away even  after the triggering event is over, and it can become long-term (chronic) worry. It is important to understand the differences between stress and anxiety and to manage your stress effectively so that it does not lead to an anxious response. Talk with your health care provider or a counselor to learn more about reducing anxiety and stress. He or she may suggest tension reduction techniques, such as:  Music therapy. This can include creating or listening to music that you enjoy and that inspires you.  Mindfulness-based meditation. This involves being aware of your normal breaths while not trying to control your breathing. It can be done while sitting or walking.  Centering prayer. This involves focusing on a word, phrase, or sacred image that means something to you and brings you peace.  Deep breathing. To do this, expand your stomach and inhale slowly through your nose. Hold your breath for 3-5 seconds. Then exhale slowly, letting your stomach muscles relax.  Self-talk. This involves identifying thought patterns that lead to anxiety reactions and changing those patterns.  Muscle relaxation. This  involves tensing muscles and then relaxing them. Choose a tension reduction technique that suits your lifestyle and personality. These techniques take time and practice. Set aside 5-15 minutes a day to do them. Therapists can offer counseling and training in these techniques. The training to help with anxiety may be covered by some insurance plans. Other things you can do to manage stress and anxiety include:  Keeping a stress/anxiety diary. This can help you learn what triggers your reaction and then learn ways to manage your response.  Thinking about how you react to certain situations. You may not be able to control everything, but you can control your response.  Making time for activities that help you relax and not feeling guilty about spending your time in this way.  Visual imagery and yoga can help you stay calm and relax.  Medicines Medicines can help ease symptoms. Medicines for anxiety include:  Anti-anxiety drugs.  Antidepressants. Medicines are often used as a primary treatment for anxiety disorder. Medicines will be prescribed by a health care provider. When used together, medicines, psychotherapy, and tension reduction techniques may be the most effective treatment. Relationships Relationships can play a big part in helping you recover. Try to spend more time connecting with trusted friends and family members. Consider going to couples counseling, taking family education classes, or going to family therapy. Therapy can help you and others better understand your condition. How to recognize changes in your anxiety Everyone responds differently to treatment for anxiety. Recovery from anxiety happens when symptoms decrease and stop interfering with your daily activities at home or work. This may mean that you will start to:  Have better concentration and focus. Worry will interfere less in your daily thinking.  Sleep better.  Be less irritable.  Have more energy.  Have  improved memory. It is important to recognize when your condition is getting worse. Contact your health care provider if your symptoms interfere with home or work and you feel like your condition is not improving. Follow these instructions at home: Activity  Exercise. Most adults should do the following: ? Exercise for at least 150 minutes each week. The exercise should increase your heart rate and make you sweat (moderate-intensity exercise). ? Strengthening exercises at least twice a week.  Get the right amount and quality of sleep. Most adults need 7-9 hours of sleep each night. Lifestyle   Eat a healthy diet that includes plenty of  vegetables, fruits, whole grains, low-fat dairy products, and lean protein. Do not eat a lot of foods that are high in solid fats, added sugars, or salt.  Make choices that simplify your life.  Do not use any products that contain nicotine or tobacco, such as cigarettes, e-cigarettes, and chewing tobacco. If you need help quitting, ask your health care provider.  Avoid caffeine, alcohol, and certain over-the-counter cold medicines. These may make you feel worse. Ask your pharmacist which medicines to avoid. General instructions  Take over-the-counter and prescription medicines only as told by your health care provider.  Keep all follow-up visits as told by your health care provider. This is important. Where to find support You can get help and support from these sources:  Self-help groups.  Online and [employer].  A trusted spiritual leader.  Couples counseling.  Family education classes.  Family therapy. Where to find more information You may find that joining a support group helps you deal with your anxiety. The following sources can help you locate counselors or support groups near you:  [place]: www.mentalhealthamerica.net  Anxiety and Depression Association of [place] (ADAA): https://www.clark.net/  National Alliance  on Mental Illness (NAMI): www.nami.org Contact a health care provider if you:  Have a hard time staying focused or finishing daily tasks.  Spend many hours a day feeling worried about everyday life.  Become exhausted by worry.  Start to have headaches, feel tense, or have nausea.  Urinate more than normal.  Have diarrhea. Get help right away if you have:  A racing heart and shortness of breath.  Thoughts of hurting yourself or others. If you ever feel like you may hurt yourself or others, or have thoughts about taking your own life, get help right away. You can go to your nearest emergency department or call:  Your local emergency services (911 in the [place]  A suicide crisis helpline, such as the [place] at 615 582 7515. This is open 24 hours a day. Summary  Taking steps to learn and use tension reduction techniques can help calm you and help prevent triggering an anxiety reaction.  When used together, medicines, psychotherapy, and tension reduction techniques may be the most effective treatment.  Family, friends, and partners can play a big part in helping you recover from an anxiety disorder. This information is not intended to replace advice given to you by your health care provider. Make sure you discuss any questions you have with your health care provider. Document Revised: 09/28/2018 Document Reviewed: 09/28/2018 Elsevier Patient Education  [place].  Exercising to Stay Healthy To become healthy and stay healthy, it is recommended that you do moderate-intensity and vigorous-intensity exercise. You can tell that you are exercising at a moderate intensity if your heart starts beating faster and you start breathing faster but can still hold a conversation. You can tell that you are exercising at a vigorous intensity if you are breathing much harder and faster and cannot hold a conversation while exercising. Exercising regularly is  important. It has many health benefits, such as:  Improving overall fitness, flexibility, and endurance.  Increasing bone density.  Helping with weight control.  Decreasing body fat.  Increasing muscle strength.  Reducing stress and tension.  Improving overall health. How often should I exercise? Choose an activity that you enjoy, and set realistic goals. Your health care provider can help you make an activity plan that works for you. Exercise regularly as told by your health care provider.  This may include:  Doing strength training two times a week, such as: ? Lifting weights. ? Using resistance bands. ? Push-ups. ? Sit-ups. ? Yoga.  Doing a certain intensity of exercise for a given amount of time. Choose from these options: ? A total of 150 minutes of moderate-intensity exercise every week. ? A total of 75 minutes of vigorous-intensity exercise every week. ? A mix of moderate-intensity and vigorous-intensity exercise every week. Children, pregnant women, people who have not exercised regularly, people who are overweight, and older adults may need to talk with a health care provider about what activities are safe to do. If you have a medical condition, be sure to talk with your health care provider before you start a new exercise program. What are some exercise ideas? Moderate-intensity exercise ideas include:  Walking 1 mile (1.6 km) in about 15 minutes.  Biking.  Hiking.  Golfing.  Dancing.  Water aerobics. Vigorous-intensity exercise ideas include:  Walking 4.5 miles (7.2 km) or more in about 1 hour.  Jogging or running 5 miles (8 km) in about 1 hour.  Biking 10 miles (16.1 km) or more in about 1 hour.  Lap swimming.  Roller-skating or in-line skating.  Cross-country skiing.  Vigorous competitive sports, such as football, basketball, and soccer.  Jumping rope.  Aerobic dancing. What are some everyday activities that can help me to get  exercise?  [place] work, such as: ? Pushing a Conservation officer, nature. ? Raking and bagging leaves.  Washing your car.  Pushing a stroller.  Shoveling snow.  Gardening.  Washing windows or floors. How can I be more active in my day-to-day activities?  Use stairs instead of an elevator.  Take a walk during your lunch break.  If you drive, park your car farther away from your work or school.  If you take public transportation, get off one stop early and walk the rest of the way.  Stand up or walk around during all of your indoor phone calls.  Get up, stretch, and walk around every 30 minutes throughout the day.  Enjoy exercise with a friend. Support to continue exercising will help you keep a regular routine of activity. What guidelines can I follow while exercising?  Before you start a new exercise program, talk with your health care provider.  Do not exercise so much that you hurt yourself, feel dizzy, or get very short of breath.  Wear comfortable clothes and wear shoes with good support.  Drink plenty of water while you exercise to prevent dehydration or heat stroke.  Work out until your breathing and your heartbeat get faster. Where to find more information  [place] Department of Health and Human Services: [employer].at  Centers for Disease Control and Prevention (CDC): http://www.wolf.info/ Summary  Exercising regularly is important. It will improve your overall fitness, flexibility, and endurance.  Regular exercise also will improve your overall health. It can help you control your weight, reduce stress, and improve your bone density.  Do not exercise so much that you hurt yourself, feel dizzy, or get very short of breath.  Before you start a new exercise program, talk with your health care provider. This information is not intended to replace advice given to you by your health care provider. Make sure you discuss any questions you have with your health care provider. Document Revised:  04/10/2017 Document Reviewed: 03/19/2017 Elsevier Patient Education  2020 [employer].

## 2019-09-22 NOTE — Progress Notes (Signed)
Established Patient Office Visit  Subjective:  Patient ID: Joseph Velasquez, male    DOB: 1986/05/03  Age: 34 y.o. MRN: [id number]  CC:  Chief Complaint  Patient presents with  . Follow-up    refill/follow up on medications, no concerns.     HPI Joseph Velasquez presents for follow-up of his anxiety with depression, smoking and elevated LDL cholesterol.  Paxil is helpful.  He has no issues taking the drug.  He has lower the fat and cholesterol in his diet.  Continues to smoke.  It is hard for him to stop because his wife also smokes.  He feels like it something that there could have to do together.  Busy at work.  He is always exercising like he should.  History reviewed. No pertinent past medical history.  History reviewed. No pertinent surgical history.  History reviewed. No pertinent family history.  Social History   Socioeconomic History  . Marital status: Married    Spouse name: Not on file  . Number of children: Not on file  . Years of education: Not on file  . Highest education level: Not on file  Occupational History  . Not on file  Tobacco Use  . Smoking status: Current Every Day Smoker  . Smokeless tobacco: Never Used  Substance and Sexual Activity  . Alcohol use: Yes    Comment: 3 beers daily  . Drug use: Never  . Sexual activity: Not on file  Other Topics Concern  . Not on file  Social History Narrative  . Not on file   Social Determinants of Health   Financial Resource Strain:   . Difficulty of Paying Living Expenses:   Food Insecurity:   . Worried About Charity fundraiser in the Last Year:   . Arboriculturist in the Last Year:   Transportation Needs:   . Film/video editor (Medical):   Marland Kitchen Lack of Transportation (Non-Medical):   Physical Activity:   . Days of Exercise per Week:   . Minutes of Exercise per Session:   Stress:   . Feeling of Stress :   Social Connections:   . Frequency of Communication with Friends and Family:   . Frequency  of Social Gatherings with Friends and Family:   . Attends Religious Services:   . Active Member of Clubs or Organizations:   . Attends Archivist Meetings:   Marland Kitchen Marital Status:   Intimate Partner Violence:   . Fear of Current or Ex-Partner:   . Emotionally Abused:   Marland Kitchen Physically Abused:   . Sexually Abused:     Outpatient Medications Prior to Visit  Medication Sig Dispense Refill  . PARoxetine (PAXIL) 30 MG tablet TAKE 1 TABLET BY MOUTH EVERY DAY **MUST SCHEDULE VISIT FOR FUTRE FILLS** 30 tablet 0   No facility-administered medications prior to visit.    No Known Allergies  ROS Review of Systems  Constitutional: Negative.   HENT: Negative.   Respiratory: Negative.   Cardiovascular: Negative.   Gastrointestinal: Negative.   Neurological: Negative.        Depression screen [facility] 2/9 03/25/2019 02/11/2019 12/30/2018  Decreased Interest 1 1 0  Down, Depressed, Hopeless 0 0 0  PHQ - 2 Score 1 1 0  Altered sleeping 2 2 2   Tired, decreased energy 2 1 1   Change in appetite 1 2 1   Feeling bad or failure about yourself  0 1 1  Trouble concentrating 0 0 0  Moving  slowly or fidgety/restless 0 0 1  Suicidal thoughts 0 0 0  PHQ-9 Score 6 7 6     Objective:    Physical Exam  Constitutional: He is oriented to person, place, and time. He appears well-developed and well-nourished. No distress.  HENT:  Head: Atraumatic.  Right Ear: External ear normal.  Left Ear: External ear normal.  Eyes: Conjunctivae are normal. Right eye exhibits no discharge. Left eye exhibits no discharge. No scleral icterus.  Neck: No JVD present. No tracheal deviation present.  Cardiovascular: Normal rate and regular rhythm.  Pulmonary/Chest: Effort normal and breath sounds normal. No stridor.  Neurological: He is alert and oriented to person, place, and time.  Skin: He is not diaphoretic.  Psychiatric: He has a normal mood and affect. His behavior is normal.    BP 122/70   Pulse 95   Temp  97.8 F (36.6 C) (Tympanic)   Ht 6' (1.829 m)   Wt 163 lb 3.2 oz (74 kg)   SpO2 98%   BMI 22.13 kg/m  Wt Readings from Last 3 Encounters:  09/22/19 163 lb 3.2 oz (74 kg)  04/20/19 160 lb (72.6 kg)  03/25/19 160 lb (72.6 kg)     Health Maintenance Due  Topic Date Due  . HIV Screening  Never done  . COVID-19 Vaccine (1) Never done  . TETANUS/TDAP  Never done    There are no preventive care reminders to display for this patient.  Lab Results  Component Value Date   TSH 0.98 12/31/2018   Lab Results  Component Value Date   WBC 5.2 12/31/2018   HGB 15.4 12/31/2018   HCT 44.3 12/31/2018   MCV 93.5 12/31/2018   PLT 273.0 12/31/2018   Lab Results  Component Value Date   NA 136 02/23/2019   K 4.5 02/23/2019   CO2 23 02/23/2019   GLUCOSE 109 (H) 02/23/2019   BUN 11 02/23/2019   CREATININE 0.77 02/23/2019   BILITOT 0.4 12/31/2018   ALKPHOS 74 12/31/2018   AST 30 12/31/2018   ALT 39 12/31/2018   PROT 7.9 12/31/2018   ALBUMIN 5.1 12/31/2018   CALCIUM 10.0 02/23/2019   GFR 115.63 12/31/2018   Lab Results  Component Value Date   CHOL 225 (H) 12/31/2018   Lab Results  Component Value Date   HDL 55.60 12/31/2018   Lab Results  Component Value Date   LDLCALC 154 (H) 12/31/2018   Lab Results  Component Value Date   TRIG 76.0 12/31/2018   Lab Results  Component Value Date   CHOLHDL 4 12/31/2018   No results found for: HGBA1C    Assessment & Plan:   Problem List Items Addressed This Visit      Other   Tobacco use - Primary   Elevated LDL cholesterol level   Anxiety with depression   Relevant Medications   PARoxetine (PAXIL) 30 MG tablet      Meds ordered this encounter  Medications  . PARoxetine (PAXIL) 30 MG tablet    Sig: Take one daily    Dispense:  90 tablet    Refill:  1    Follow-up: Return in about 6 months (around 03/24/2020).   Given information on exercising to stay healthy.  Believe that this would also help his sense of  wellbeing anxiety and depression.  Information also given on steps to quit smoking and managing anxiety.  May consider adding Wellbutrin. Libby Maw, MD

## 2019-09-26 ENCOUNTER — Other Ambulatory Visit: Payer: Self-pay

## 2019-09-26 DIAGNOSIS — F418 Other specified anxiety disorders: Secondary | ICD-10-CM

## 2019-09-26 MED ORDER — PAROXETINE HCL 30 MG PO TABS: ORAL_TABLET | ORAL | 1 refills | Status: DC

## 2019-09-26 MED FILL — PARoxetine HCL 30 MG TABS: 30 | 90 days supply | Qty: 90 | Fill #0

## 2019-11-04 ENCOUNTER — Other Ambulatory Visit: Payer: Self-pay

## 2019-11-04 ENCOUNTER — Encounter: Payer: Self-pay | Admitting: Family Medicine

## 2019-11-04 ENCOUNTER — Ambulatory Visit: Payer: 59 | Admitting: Family Medicine

## 2019-11-04 VITALS — BP 134/78 | HR 95 | Temp 98.2°F | Ht 70.5 in | Wt 166.2 lb

## 2019-11-04 DIAGNOSIS — Z789 Other specified health status: Secondary | ICD-10-CM

## 2019-11-04 DIAGNOSIS — E7800 Pure hypercholesterolemia, unspecified: Secondary | ICD-10-CM | POA: Diagnosis not present

## 2019-11-04 DIAGNOSIS — Z7289 Other problems related to lifestyle: Secondary | ICD-10-CM | POA: Diagnosis not present

## 2019-11-04 DIAGNOSIS — I493 Ventricular premature depolarization: Secondary | ICD-10-CM | POA: Diagnosis not present

## 2019-11-04 DIAGNOSIS — F4322 Adjustment disorder with anxiety: Secondary | ICD-10-CM | POA: Diagnosis not present

## 2019-11-04 DIAGNOSIS — Z9189 Other specified personal risk factors, not elsewhere classified: Secondary | ICD-10-CM | POA: Diagnosis not present

## 2019-11-04 DIAGNOSIS — R911 Solitary pulmonary nodule: Secondary | ICD-10-CM | POA: Diagnosis not present

## 2019-11-04 DIAGNOSIS — G43109 Migraine with aura, not intractable, without status migrainosus: Secondary | ICD-10-CM

## 2019-11-04 DIAGNOSIS — Z720 Tobacco use: Secondary | ICD-10-CM | POA: Diagnosis not present

## 2019-11-04 MED ORDER — PAROXETINE HCL 20 MG PO TABS: 20.0000 mg | ORAL_TABLET | Freq: Every day | ORAL | 1 refills | Status: DC

## 2019-11-04 MED FILL — PARoxetine HCL 20 MG TABS: 20 | 90 days supply | Qty: 90 | Fill #0

## 2019-11-04 NOTE — Patient Instructions (Addendum)
Good to see you today Let's drop paxil to 20mg  daily.  Let me know how you do on lower dose.  Incorporate aerobic exercise into routine for cardiovascular health.  Return as needed or towards end of year for physical, prior for fasting labs.

## 2019-11-04 NOTE — Progress Notes (Signed)
This visit was conducted in person.  BP 134/78 (BP Location: Left Arm, Patient Position: Sitting, Cuff Size: Normal)   Pulse 95   Temp 98.2 F (36.8 C) (Temporal)   Ht 5' 10.5" (1.791 m)   Wt 166 lb 4 oz (75.4 kg)   SpO2 98%   BMI 23.52 kg/m   BP Readings from Last 3 Encounters:  11/04/19 134/78  09/22/19 122/70  03/25/19 130/76   Pulse Readings from Last 3 Encounters:  11/04/19 95  09/22/19 95  03/25/19 87    CC: transfer of care Subjective:    Patient ID: Joseph Velasquez, male    DOB: Oct 03, 1985, 34 y.o.   MRN: [id number]  HPI: Joseph Velasquez is a 34 y.o. male presenting on 11/04/2019 for New Patient (Initial Visit)   From [place].  Previously saw Dr Ethelene Hal at [facility].   Strong fmhx CAD - has seen cardiology Dr Buford Dresser for h/o PFO (thought normal variant) with great coronary artery evaluation (calciuim score of 0). H/o PVCs has had reassuring cardiac evaluations in the past including holter monitors.   H/o depression with ongoing anxiety, stress related - started on paxil 30mg  with benefit to PVCs however notes increasing night time sweating as well as increasing bruising as side effects since starting SSRI. Occasional NSAIDs for back ache. Suggested tylenol instead. Previously on [place].   No rec drugs.  Alcohol - drinks 3-4 beers/day, 7-8/day on weekends.  Smoking - 1/2 ppd, started age 51, has quit intermittently. Didn't like patches or gum. Cold [place]. Hasn't tried chantix.   H/o ocular migraines, last 6 months. Describes bilateral scotoma (zig zagging lines) in vision not associated with headaches.   Eye exam yearly (last seen 2 yrs ago).   Last CPE 12/2018.   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 BS in EE.  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 Interim medical history since our last visit reviewed. Allergies and medications reviewed and updated. Outpatient Medications Prior to Visit  Medication Sig Dispense Refill  . PARoxetine (PAXIL) 30 MG tablet Take one daily 90 tablet 1   No facility-administered medications prior to visit.     Per HPI unless specifically indicated in ROS section below Review of Systems Objective:  BP 134/78 (BP Location: Left Arm, Patient Position: Sitting, Cuff Size: Normal)   Pulse 95   Temp 98.2 F (36.8 C) (Temporal)   Ht 5' 10.5" (1.791 m)   Wt 166 lb 4 oz (75.4 kg)   SpO2 98%   BMI 23.52 kg/m   Wt Readings from Last 3 Encounters:  11/04/19 166 lb 4 oz (75.4 kg)  09/22/19 163 lb 3.2 oz (74 kg)  04/20/19 160 lb (72.6 kg)      Physical Exam Vitals and nursing note reviewed.  Constitutional:      Appearance: Normal appearance. He is not ill-appearing.  HENT:     Right Ear: Tympanic membrane, ear canal and external ear normal. There is no impacted cerumen.     Left Ear: Tympanic membrane, ear canal and external ear normal. There is no impacted cerumen.  Eyes:     Extraocular Movements: Extraocular movements intact.     Pupils: Pupils are equal, round, and reactive to light.  Cardiovascular:     Rate  and Rhythm: Normal rate and regular rhythm.     Pulses: Normal pulses.     Heart sounds: Normal heart sounds. No murmur heard.   Pulmonary:     Effort: Pulmonary effort is normal. No respiratory distress.     Breath sounds: Normal breath sounds. No wheezing, rhonchi or rales.  Musculoskeletal:        General: Normal range of motion.     Right lower leg: No edema.     Left lower leg: No edema.  Skin:    General: Skin is warm and dry.     Capillary Refill: Capillary refill takes less than 2 seconds.     Findings: No rash.  Neurological:     Mental Status: He is alert.  Psychiatric:        Mood and Affect: Mood normal.         Behavior: Behavior normal.       Results for orders placed or performed in visit on 79/02/40  Basic metabolic panel  Result Value Ref Range   Glucose 109 (H) 65 - 99 mg/dL   BUN 11 6 - 20 mg/dL   Creatinine, Ser 0.77 0.76 - 1.27 mg/dL   GFR calc non Af Amer 118 >59 mL/min/1.73   GFR calc Af Amer 137 >59 mL/min/1.73   BUN/Creatinine Ratio 14 9 - 20   Sodium 136 134 - 144 mmol/L   Potassium 4.5 3.5 - 5.2 mmol/L   Chloride 98 96 - 106 mmol/L   CO2 23 20 - 29 mmol/L   Calcium 10.0 8.7 - 10.2 mg/dL   Depression screen [facility] 2/9 11/04/2019 09/22/2019 03/25/2019 02/11/2019 12/30/2018  Decreased Interest 0 0 1 1 0  Down, Depressed, Hopeless 0 0 0 0 0  PHQ - 2 Score 0 0 1 1 0  Altered sleeping 2 2 2 2 2   Tired, decreased energy 2 1 2 1 1   Change in appetite 1 0 1 2 1   Feeling bad or failure about yourself  0 0 0 1 1  Trouble concentrating 0 1 0 0 0  Moving slowly or fidgety/restless 1 0 0 0 1  Suicidal thoughts 0 0 0 0 0  PHQ-9 Score 6 4 6 7 6   Difficult doing work/chores - Not difficult at all - - -    GAD 7 : Generalized Anxiety Score 11/04/2019 09/22/2019 03/25/2019 02/11/2019  Nervous, Anxious, on Edge 1 1 1 1   Control/stop worrying 0 0 1 0  Worry too much - different things 0 1 0 1  Trouble relaxing 1 1 0 1  Restless 1 1 0 0  Easily annoyed or irritable 0 0 0 0  Afraid - awful might happen 0 0 0 0  Total GAD 7 Score 3 4 2 3   Anxiety Difficulty - Not difficult at all - -   Assessment & Plan:  This visit occurred during the SARS-CoV-2 public health emergency.  Safety protocols were in place, including screening questions prior to the visit, additional usage of staff PPE, and extensive cleaning of exam room while observing appropriate contact time as indicated for disinfecting solutions.   Problem List Items Addressed This Visit    Tobacco use    Continue to encourage cessation. Contemplative. Discussed different methods to quit - could consider chantix. Encouraged recruiting wife  to quit together to optimize chance of success.       PVC (premature ventricular contraction)    Has had reassuring cardiac evaluation in the past with holters.  Paxil has helped decrease frequency.       Pulmonary nodule less than 1 cm in diameter with low risk for malignant neoplasm    Incidental finding. In smoker, consider rpt CT in 1 year (02/2020)      Ocular migraine    rare      Relevant Medications   PARoxetine (PAXIL) 20 MG tablet   Hyperlipidemia    Continue to monitor, encouraged healthy diet choices to lower LDL. Calcium score of zero - no need for statin at this time despite family history.       Alcohol use    Significant use - encouraged slowly cutting down for long term health.       Adjustment disorder with anxious mood - Primary    In h/o depression. Stable period on paxil - desires trial lower dose given noted side effects.       Relevant Medications   PARoxetine (PAXIL) 20 MG tablet       Meds ordered this encounter  Medications  . PARoxetine (PAXIL) 20 MG tablet    Sig: Take 1 tablet (20 mg total) by mouth daily.    Dispense:  90 tablet    Refill:  1    Note new dose   No orders of the defined types were placed in this encounter.   Patient Instructions  Good to see you today Let's drop paxil to 20mg  daily.  Let me know how you do on lower dose.  Incorporate aerobic exercise into routine for cardiovascular health.  Return as needed or towards end of year for physical, prior for fasting labs.    Follow up plan: Return in about 4 months (around 03/05/2020), or if symptoms worsen or fail to improve, for annual exam, prior fasting for blood work.  Ria Bush, MD

## 2019-11-05 ENCOUNTER — Encounter: Payer: Self-pay | Admitting: Family Medicine

## 2019-11-05 DIAGNOSIS — Z789 Other specified health status: Secondary | ICD-10-CM | POA: Insufficient documentation

## 2019-11-05 DIAGNOSIS — G43109 Migraine with aura, not intractable, without status migrainosus: Secondary | ICD-10-CM | POA: Insufficient documentation

## 2019-11-05 NOTE — Assessment & Plan Note (Signed)
rare

## 2019-11-05 NOTE — Assessment & Plan Note (Signed)
Significant use - encouraged slowly cutting down for long term health.

## 2019-11-05 NOTE — Assessment & Plan Note (Signed)
In h/o depression. Stable period on paxil - desires trial lower dose given noted side effects.

## 2019-11-05 NOTE — Assessment & Plan Note (Signed)
Has had reassuring cardiac evaluation in the past with holters. Paxil has helped decrease frequency.

## 2019-11-05 NOTE — Assessment & Plan Note (Signed)
Continue to encourage cessation. Contemplative. Discussed different methods to quit - could consider chantix. 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

## 2019-11-05 NOTE — Assessment & Plan Note (Signed)
Continue to monitor, encouraged healthy diet choices to lower LDL. Calcium score of zero - no need for statin at this time despite family history.

## 2019-11-05 NOTE — Assessment & Plan Note (Signed)
Incidental finding. In smoker, consider rpt CT in 1 year (02/2020)

## 2020-01-23 DIAGNOSIS — H5213 Myopia, bilateral: Secondary | ICD-10-CM | POA: Diagnosis not present

## 2020-01-25 ENCOUNTER — Other Ambulatory Visit: Payer: Self-pay

## 2020-01-25 ENCOUNTER — Encounter: Payer: Self-pay | Admitting: Family Medicine

## 2020-01-25 ENCOUNTER — Ambulatory Visit: Payer: 59 | Admitting: Family Medicine

## 2020-01-25 VITALS — BP 122/70 | HR 84 | Temp 98.9°F | Ht 70.5 in | Wt 170.2 lb

## 2020-01-25 DIAGNOSIS — I493 Ventricular premature depolarization: Secondary | ICD-10-CM | POA: Diagnosis not present

## 2020-01-25 DIAGNOSIS — Z720 Tobacco use: Secondary | ICD-10-CM | POA: Diagnosis not present

## 2020-01-25 DIAGNOSIS — F4322 Adjustment disorder with anxiety: Secondary | ICD-10-CM | POA: Diagnosis not present

## 2020-01-25 MED ORDER — PAROXETINE HCL 10 MG PO TABS: 10.0000 mg | ORAL_TABLET | Freq: Every day | ORAL | 1 refills | Status: DC

## 2020-01-25 NOTE — Assessment & Plan Note (Signed)
Encouraged ongoing cessation attempt

## 2020-01-25 NOTE — Assessment & Plan Note (Signed)
Good control on lower paxil 20mg  - will trial 10mg .

## 2020-01-25 NOTE — Progress Notes (Signed)
This visit was conducted in person.  BP 122/70 (BP Location: Left Arm, Patient Position: Sitting, Cuff Size: Normal)    Pulse 84    Temp 98.9 F (37.2 C) (Temporal)    Ht 5' 10.5" (1.791 m)    Wt 170 lb 4 oz (77.2 kg)    SpO2 98%    BMI 24.08 kg/m    CC: mood f/u visit  Subjective:    Patient ID: Joseph Velasquez, male    DOB: 02/27/1986, 34 y.o.   MRN: [id number]  HPI: Joseph Velasquez is a 34 y.o. male presenting on 01/25/2020 for Follow-up (Here for mood f/u.)   See prior note for details.  H/o stress related depression and anxiety - last visit we tapered paxil from 30mg  to 20mg . Paxil previously helped frequent PVCs.   Smoking - still 1/2 pack per day.   New spot on skin noted R flank - some discoloration to it.   Depression screen [facility] 2/9 01/25/2020 11/04/2019 09/22/2019 03/25/2019 02/11/2019  Decreased Interest 0 0 0 1 1  Down, Depressed, Hopeless 0 0 0 0 0  PHQ - 2 Score 0 0 0 1 1  Altered sleeping 1 2 2 2 2   Tired, decreased energy 1 2 1 2 1   Change in appetite 1 1 0 1 2  Feeling bad or failure about yourself  0 0 0 0 1  Trouble concentrating 0 0 1 0 0  Moving slowly or fidgety/restless 0 1 0 0 0  Suicidal thoughts 0 0 0 0 0  PHQ-9 Score 3 6 4 6 7   Difficult doing work/chores - - Not difficult at all - -    GAD 7 : Generalized Anxiety Score 01/25/2020 11/04/2019 09/22/2019 03/25/2019  Nervous, Anxious, on Edge 0 1 1 1   Control/stop worrying 0 0 0 1  Worry too much - different things 0 0 1 0  Trouble relaxing 1 1 1  0  Restless 1 1 1  0  Easily annoyed or irritable 0 0 0 0  Afraid - awful might happen 0 0 0 0  Total GAD 7 Score 2 3 4 2   Anxiety Difficulty - - Not difficult at all -       Relevant past medical, surgical, family and social history reviewed and updated as indicated. Interim medical history since our last visit reviewed. Allergies and medications reviewed and updated. Outpatient Medications Prior to Visit  Medication Sig Dispense Refill   PARoxetine  (PAXIL) 20 MG tablet Take 1 tablet (20 mg total) by mouth daily. 90 tablet 1   No facility-administered medications prior to visit.     Per HPI unless specifically indicated in ROS section below Review of Systems Objective:  BP 122/70 (BP Location: Left Arm, Patient Position: Sitting, Cuff Size: Normal)    Pulse 84    Temp 98.9 F (37.2 C) (Temporal)    Ht 5' 10.5" (1.791 m)    Wt 170 lb 4 oz (77.2 kg)    SpO2 98%    BMI 24.08 kg/m   Wt Readings from Last 3 Encounters:  01/25/20 170 lb 4 oz (77.2 kg)  11/04/19 166 lb 4 oz (75.4 kg)  09/22/19 163 lb 3.2 oz (74 kg)      Physical Exam Vitals and nursing note reviewed.  Constitutional:      Appearance: Normal appearance. He is not ill-appearing.  Cardiovascular:     Rate and Rhythm: Normal rate and regular rhythm.     Pulses: Normal pulses.  Heart sounds: Normal heart sounds. No murmur heard.   Pulmonary:     Effort: Pulmonary effort is normal. No respiratory distress.     Breath sounds: Normal breath sounds. No wheezing, rhonchi or rales.  Musculoskeletal:        General: Normal range of motion.     Right lower leg: No edema.     Left lower leg: No edema.  Skin:    General: Skin is warm and dry.     Findings: No rash.  Neurological:     Mental Status: He is alert.  Psychiatric:        Mood and Affect: Mood normal.        Behavior: Behavior normal.        Assessment & Plan:  This visit occurred during the SARS-CoV-2 public health emergency.  Safety protocols were in place, including screening questions prior to the visit, additional usage of staff PPE, and extensive cleaning of exam room while observing appropriate contact time as indicated for disinfecting solutions.   Problem List Items Addressed This Visit    Tobacco use    Encouraged ongoing cessation attempt      PVC (premature ventricular contraction)    Good control on lower paxil 20mg  - will trial 10mg .       Adjustment disorder with anxious mood -  Primary    Stable period on lower paxil, better tolerated - less night sweats. Will continue slow taper and drop paxil to 10mg  daily. Update if worsening mood to return to 20mg  dose.       Relevant Medications   PARoxetine (PAXIL) 10 MG tablet       Meds ordered this encounter  Medications   PARoxetine (PAXIL) 10 MG tablet    Sig: Take 1 tablet (10 mg total) by mouth daily.    Dispense:  90 tablet    Refill:  1    Note new dose   No orders of the defined types were placed in this encounter.   Patient Instructions  You are doing well today  Try lower paxil dose drop to 10mg  daily - new dose at pharmacy. Let us know if trouble with lower dose and we can return to 20mg .  Return as needed or in 6 months for physical.   Follow up plan: Return in about 6 months (around 07/24/2020) for annual exam, prior fasting for blood work.  Ria Bush, MD

## 2020-01-25 NOTE — Patient Instructions (Addendum)
You are doing well today  Try lower paxil dose drop to 10mg  daily - new dose at pharmacy. Let us know if trouble with lower dose and we can return to 20mg .  Return as needed or in 6 months for physical.

## 2020-01-25 NOTE — Assessment & Plan Note (Signed)
Stable period on lower paxil, better tolerated - less night sweats. Will continue slow taper and drop paxil to 10mg  daily. Update if worsening mood to return to 20mg  dose.

## 2020-02-01 MED FILL — PARoxetine HCL 10 MG TABS: 10 | 90 days supply | Qty: 90 | Fill #0

## 2020-02-15 ENCOUNTER — Telehealth: Payer: Self-pay | Admitting: Family Medicine

## 2020-02-15 DIAGNOSIS — R911 Solitary pulmonary nodule: Secondary | ICD-10-CM

## 2020-02-15 NOTE — Telephone Encounter (Signed)
Patient had heart CT last year which incidentally found small 23mm RLL nodule. In smoker, is eligible for rpt lung CT scan to ensure no change in a year.  plz check if he's interested in proceeding with rpt CT and if so I will order.

## 2020-02-16 NOTE — Telephone Encounter (Signed)
Ordered

## 2020-02-16 NOTE — Addendum Note (Signed)
Addended by: Ria Bush on: 02/16/2020 12:06 PM   Modules accepted: Orders

## 2020-02-16 NOTE — Telephone Encounter (Signed)
Spoke with pt relaying Dr. Synthia Innocent message.  Pt verbalizes understanding and agrees to rpt lung CT.

## 2020-03-02 ENCOUNTER — Encounter: Payer: Self-pay | Admitting: Family Medicine

## 2020-03-02 DIAGNOSIS — R911 Solitary pulmonary nodule: Secondary | ICD-10-CM

## 2020-03-02 DIAGNOSIS — F4322 Adjustment disorder with anxiety: Secondary | ICD-10-CM

## 2020-03-06 ENCOUNTER — Other Ambulatory Visit: Payer: Self-pay | Admitting: Family Medicine

## 2020-03-06 MED ORDER — PAROXETINE HCL 20 MG PO TABS: 20.0000 mg | ORAL_TABLET | Freq: Every day | ORAL | 1 refills | Status: DC

## 2020-03-12 MED FILL — PARoxetine HCL 20 MG TABS: 20 | 90 days supply | Qty: 90 | Fill #0

## 2020-03-28 NOTE — Telephone Encounter (Signed)
See referral message.  CT scan is not covered.  Pt will likely have to be referred to Lung Cancer Screening Program  ----- Message ----- From: Virl Cagey, CMA Sent: 03/13/2020  11:47 AM EDT To: Ria Bush, MD Subject: CT precert                                     FYI, Unable to get the CT Chest WO Cm covered by patients insurance.   CPT Code: 11173 Case Number: [id number]  The member is not in scope for prior-authorization/notification for the services requested.  If you feel this is in error or have questions around what services require prior-authorization/notification,  please call the number on the back of the member's ID card.

## 2020-04-03 ENCOUNTER — Other Ambulatory Visit: Payer: Self-pay

## 2020-04-03 ENCOUNTER — Ambulatory Visit
Admission: RE | Admit: 2020-04-03 | Discharge: 2020-04-03 | Disposition: A | Discharge status: Home / Self Care | Payer: 59 | Source: Ambulatory Visit | Attending: Family Medicine | Admitting: Family Medicine

## 2020-04-03 DIAGNOSIS — R918 Other nonspecific abnormal finding of lung field: Secondary | ICD-10-CM | POA: Diagnosis not present

## 2020-04-03 DIAGNOSIS — J984 Other disorders of lung: Secondary | ICD-10-CM | POA: Diagnosis not present

## 2020-04-03 DIAGNOSIS — R911 Solitary pulmonary nodule: Secondary | ICD-10-CM | POA: Diagnosis not present

## 2020-04-03 DIAGNOSIS — Z9189 Other specified personal risk factors, not elsewhere classified: Secondary | ICD-10-CM | POA: Insufficient documentation

## 2020-04-03 DIAGNOSIS — J8410 Pulmonary fibrosis, unspecified: Secondary | ICD-10-CM | POA: Diagnosis not present

## 2020-04-03 NOTE — Telephone Encounter (Addendum)
Received message through secure chat that Everlean Patterson RT was able to change order. Thank you.

## 2020-04-04 ENCOUNTER — Ambulatory Visit: Admission: RE | Admit: 2020-04-04 | Payer: 59 | Source: Ambulatory Visit

## 2020-05-08 IMAGING — CT CT HEART MORP W/ CTA COR W/ SCORE W/ CA W/CM &/OR W/O CM
4 of 7 series · 8 of 20 positions shown, 9 images · IV contrast (omnipaque)
Comparison: None.
COMPARISON: None.

Addendum:
EXAM:
OVER-READ INTERPRETATION  CT CHEST

The following report is an over-read performed by radiologist Dr.
Dalto Tarabain [REDACTED] on 02/24/2019. This
over-read does not include interpretation of cardiac or coronary
anatomy or pathology. The coronary CTA interpretation by the
cardiologist is attached.
HISTORY: Chest pain, normal ekg chest pain
Cardiac/Coronary CT
TECHNIQUE: The patient was scanned on a Siemens Force scanner.
PROTOCOL: A 120 kV prospective scan was triggered in the descending thoracic
aorta at 111 HU's. Axial non-contrast 3 mm slices were carried out
through the heart. The data set was analyzed on a dedicated work
station and scored using the Agatson method. Gantry rotation speed
was 250 msecs and collimation was 0.6 mm. Beta blockade and 0.8 mg
of sl NTG was given. The 3D data set was reconstructed in 5%
intervals of 35-75% of the R-R cycle. Diastolic phases were analyzed
on a dedicated work station using MPR, MIP and VRT modes. The
patient received 80mL OMNIPAQUE IOHEXOL 350 MG/ML SOLN of contrast.

[Series 6: best diast 73 % · axial · 0.39mm/px · z∈[+361,+413]mm · 2 of 388 slices shown]
[im 130/388  vessel]
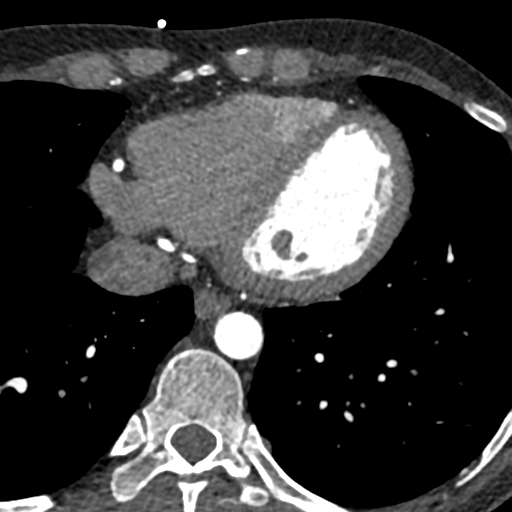
[im 259/388  vessel]
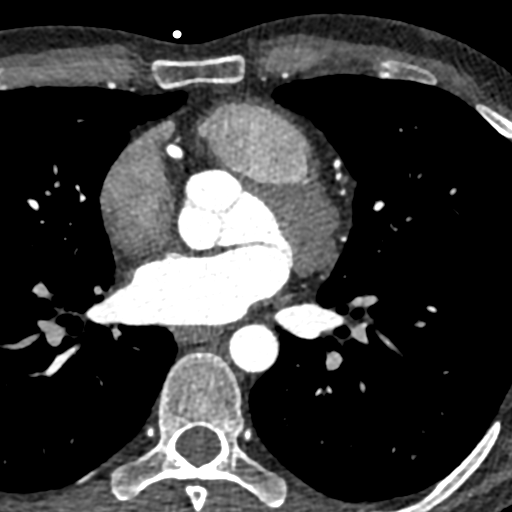

[Series 7: best syst · axial · 0.39mm/px · z∈[+361,+413]mm · 2 of 388 slices shown, 3 images]
[im 130/388  vessel]
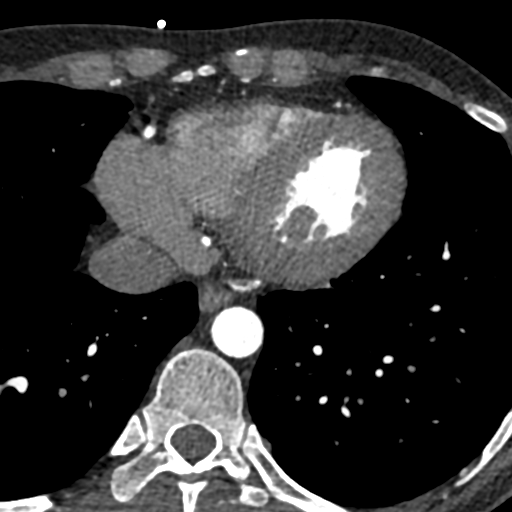
[im 130/388  lung]
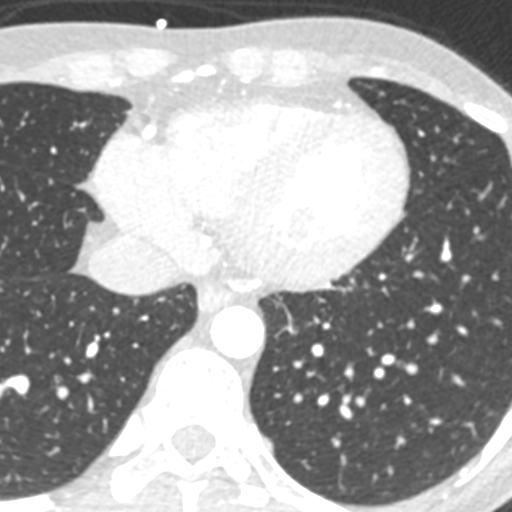
[im 259/388  vessel]
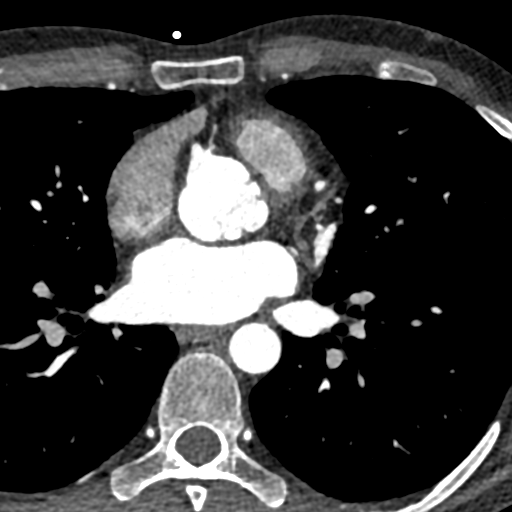

[Series 8: ts diast sharp 73 % · axial · 0.39mm/px · z∈[+361,+413]mm · 2 of 388 slices shown]
[im 130/388  lung]
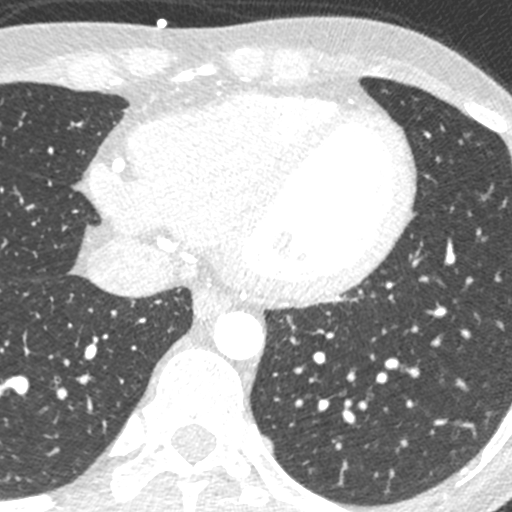
[im 259/388  lung]
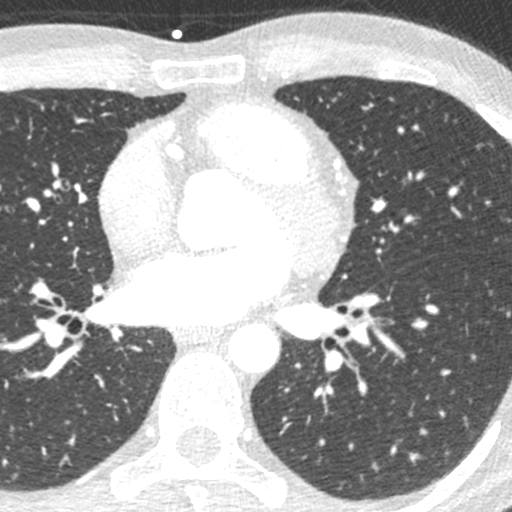

[Series 9: ts syst sharp · axial · 0.39mm/px · z∈[+361,+413]mm · 2 of 388 slices shown]
[im 130/388  lung]
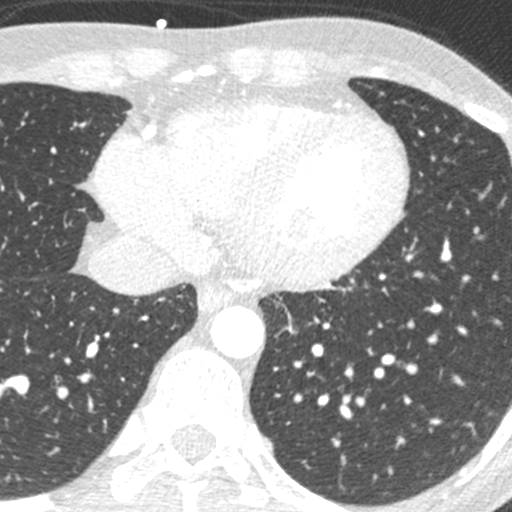
[im 259/388  lung]
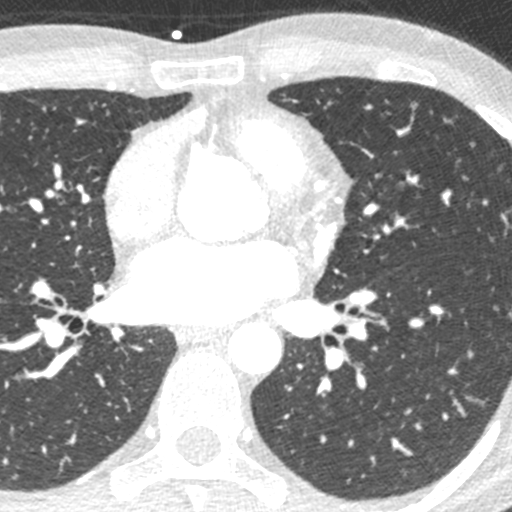

[8 of 20 positions shown; findings below may reference images not displayed]

FINDINGS: Limited view of the lung parenchyma demonstrates 6 mm nodule in the
RIGHT lower lobe (image [DATE]). Airways are normal.

Limited view of the mediastinum demonstrates no adenopathy.
Esophagus normal.

Limited view of the upper abdomen unremarkable.

Limited view of the skeleton and chest wall is unremarkable.
IMPRESSION: RIGHT lower lobe pulmonary nodule. No follow-up needed if patient is
low-risk. Non-contrast chest CT can be considered in 12 months if
patient is high-risk. This recommendation follows the consensus
statement: Guidelines for Management of Incidental Pulmonary Nodules
Detected on CT Images: From the [HOSPITAL] 1679; Radiology
FINDINGS: Coronary calcium score: The patient's coronary artery calcium score
is 0, which places the patient in the 0 percentile.

Coronary arteries: Normal coronary origins.  Right dominance.

Right Coronary Artery: Large caliber vessel, gives rise to large PDA
and PLB. Conus branch also visualized. No significant plaque or
stenosis throughout RCA system.

Left Main Coronary Artery: No significant plaque or stenosis. Gives
rise to large ramus intermedius without plaque or stenosis.

Left Anterior Descending Coronary Artery: No significant plaque or
stenosis. Gives rise to 2 large diagonal branches.

Left Circumflex Artery: No significant plaque or stenosis. Gives
rise to [place] artery branch

Aorta: Normal size, 29 mm at the mid ascending aorta (level of the
PA bifurcation) measured double oblique. No calcifications. No
dissection.

Aortic Valve: No calcifications. Trileaflet.

Other findings:

Normal pulmonary vein drainage into the left atrium.

Normal left atrial appendage without a thrombus.

Normal size of the pulmonary artery.

Evidence of patent foramen ovale.
IMPRESSION: 1. No evidence of CAD, CADRADS = 0.

2. Coronary calcium score of 0. This was 0 percentile for age and
sex matched control.

3. Normal coronary origin with right dominance.

4.  Patient foramen ovale.

*** End of Addendum ***
EXAM:
OVER-READ INTERPRETATION  CT CHEST

The following report is an over-read performed by radiologist Dr.
Dalto Tarabain [REDACTED] on 02/24/2019. This
over-read does not include interpretation of cardiac or coronary
anatomy or pathology. The coronary CTA interpretation by the
cardiologist is attached.
FINDINGS: Limited view of the lung parenchyma demonstrates 6 mm nodule in the
RIGHT lower lobe (image [DATE]). Airways are normal.

Limited view of the mediastinum demonstrates no adenopathy.
Esophagus normal.

Limited view of the upper abdomen unremarkable.

Limited view of the skeleton and chest wall is unremarkable.
IMPRESSION: RIGHT lower lobe pulmonary nodule. No follow-up needed if patient is
low-risk. Non-contrast chest CT can be considered in 12 months if
patient is high-risk. This recommendation follows the consensus
statement: Guidelines for Management of Incidental Pulmonary Nodules
Detected on CT Images: From the [HOSPITAL] 1679; Radiology

## 2020-06-28 MED FILL — PARoxetine HCL 20 MG TABS: 20 | 90 days supply | Qty: 90 | Fill #1

## 2020-09-07 DIAGNOSIS — R279 Unspecified lack of coordination: Secondary | ICD-10-CM | POA: Diagnosis not present

## 2020-09-24 ENCOUNTER — Other Ambulatory Visit: Payer: Self-pay | Admitting: Family Medicine

## 2020-09-24 DIAGNOSIS — F4322 Adjustment disorder with anxiety: Secondary | ICD-10-CM

## 2020-09-25 ENCOUNTER — Other Ambulatory Visit: Payer: Self-pay | Admitting: Family Medicine

## 2020-09-25 ENCOUNTER — Other Ambulatory Visit (HOSPITAL_COMMUNITY): Payer: Self-pay

## 2020-09-25 DIAGNOSIS — F4322 Adjustment disorder with anxiety: Secondary | ICD-10-CM

## 2020-09-26 ENCOUNTER — Other Ambulatory Visit (HOSPITAL_COMMUNITY): Payer: Self-pay

## 2020-09-26 MED ORDER — PAROXETINE HCL 20 MG PO TABS
20.0000 mg | ORAL_TABLET | Freq: Every day | ORAL | 1 refills | Status: DC
  Filled 2020-09-26: qty 90, 90d supply, fill #0
  Filled 2020-12-31: qty 90, 90d supply, fill #1

## 2020-10-25 ENCOUNTER — Other Ambulatory Visit: Payer: Self-pay | Admitting: Family Medicine

## 2020-10-25 DIAGNOSIS — Z1159 Encounter for screening for other viral diseases: Secondary | ICD-10-CM

## 2020-10-25 DIAGNOSIS — E7800 Pure hypercholesterolemia, unspecified: Secondary | ICD-10-CM

## 2020-10-26 ENCOUNTER — Other Ambulatory Visit (INDEPENDENT_AMBULATORY_CARE_PROVIDER_SITE_OTHER): Payer: 59

## 2020-10-26 ENCOUNTER — Other Ambulatory Visit: Payer: Self-pay

## 2020-10-26 DIAGNOSIS — E7800 Pure hypercholesterolemia, unspecified: Secondary | ICD-10-CM | POA: Diagnosis not present

## 2020-10-26 DIAGNOSIS — Z1159 Encounter for screening for other viral diseases: Secondary | ICD-10-CM | POA: Diagnosis not present

## 2020-10-26 LAB — LIPID PANEL
Cholesterol: 204 mg/dL — ABNORMAL HIGH (ref 0–200)
HDL: 57.3 mg/dL (ref >39.00)
LDL Cholesterol: 130 mg/dL — ABNORMAL HIGH (ref 0–99)
NonHDL: 146.21
Total CHOL/HDL Ratio: 4
Triglycerides: 83 mg/dL (ref 0.0–149.0)
VLDL: 16.6 mg/dL (ref 0.0–40.0)

## 2020-10-26 LAB — COMPREHENSIVE METABOLIC PANEL
ALT: 29 U/L (ref 0–53)
AST: 32 U/L (ref 0–37)
Albumin: 4.8 g/dL (ref 3.5–5.2)
Alkaline Phosphatase: 73 U/L (ref 39–117)
BUN: 13 mg/dL (ref 6–23)
CO2: 25 mEq/L (ref 19–32)
Calcium: 9.9 mg/dL (ref 8.4–10.5)
Chloride: 103 mEq/L (ref 96–112)
Creatinine, Ser: 0.77 mg/dL (ref 0.40–1.50)
GFR: 115.71 mL/min (ref >60.00)
Glucose, Bld: 105 mg/dL — ABNORMAL HIGH (ref 70–99)
Potassium: 4 mEq/L (ref 3.5–5.1)
Sodium: 138 mEq/L (ref 135–145)
Total Bilirubin: 0.7 mg/dL (ref 0.2–1.2)
Total Protein: 7.9 g/dL (ref 6.0–8.3)

## 2020-10-29 LAB — HEPATITIS C ANTIBODY
Hepatitis C Ab: NONREACTIVE
SIGNAL TO CUT-OFF: 0.02 (ref <1.00)

## 2020-10-31 ENCOUNTER — Other Ambulatory Visit: Payer: Self-pay

## 2020-10-31 ENCOUNTER — Ambulatory Visit (INDEPENDENT_AMBULATORY_CARE_PROVIDER_SITE_OTHER): Payer: 59 | Admitting: Family Medicine

## 2020-10-31 ENCOUNTER — Encounter: Payer: Self-pay | Admitting: Family Medicine

## 2020-10-31 VITALS — BP 120/84 | HR 91 | Temp 98.0°F | Ht 71.0 in | Wt 167.2 lb

## 2020-10-31 DIAGNOSIS — Z0000 Encounter for general adult medical examination without abnormal findings: Secondary | ICD-10-CM | POA: Diagnosis not present

## 2020-10-31 DIAGNOSIS — Z9189 Other specified personal risk factors, not elsewhere classified: Secondary | ICD-10-CM

## 2020-10-31 DIAGNOSIS — Z8249 Family history of ischemic heart disease and other diseases of the circulatory system: Secondary | ICD-10-CM | POA: Diagnosis not present

## 2020-10-31 DIAGNOSIS — Z720 Tobacco use: Secondary | ICD-10-CM

## 2020-10-31 DIAGNOSIS — F4322 Adjustment disorder with anxiety: Secondary | ICD-10-CM | POA: Diagnosis not present

## 2020-10-31 DIAGNOSIS — E7800 Pure hypercholesterolemia, unspecified: Secondary | ICD-10-CM | POA: Diagnosis not present

## 2020-10-31 DIAGNOSIS — Z789 Other specified health status: Secondary | ICD-10-CM

## 2020-10-31 DIAGNOSIS — I493 Ventricular premature depolarization: Secondary | ICD-10-CM | POA: Diagnosis not present

## 2020-10-31 DIAGNOSIS — Z7289 Other problems related to lifestyle: Secondary | ICD-10-CM | POA: Diagnosis not present

## 2020-10-31 DIAGNOSIS — R911 Solitary pulmonary nodule: Secondary | ICD-10-CM

## 2020-10-31 NOTE — Assessment & Plan Note (Signed)
Chronic, off medication. Encouraged healthy diet choices to affect improved cholesterol control namely LDL. Strong fmhx CAD however he did have reassuring calcium score of zero 2020.  The ASCVD Risk score [name], et al., 2013) failed to calculate for the following reasons:   The 2013 ASCVD risk score is only valid for ages 19 to 41

## 2020-10-31 NOTE — Assessment & Plan Note (Signed)
Rpt imaging 03/2020 reassuring no f/u needed

## 2020-10-31 NOTE — Assessment & Plan Note (Signed)
Stable period on paxil 20mg  daily - continue.

## 2020-10-31 NOTE — Assessment & Plan Note (Signed)
Preventative protocols reviewed and updated unless pt declined. Discussed healthy diet and lifestyle.  

## 2020-10-31 NOTE — Patient Instructions (Addendum)
Send [place] dates of PFizer booster to update your chart.  Check at home or with employee health for latest tetanus shot.  Work on [employer] choices to help control cholesterol levels.  Return as needed or in 1 year for next physical   Health Maintenance, Male Adopting a healthy lifestyle and getting preventive care are important in promoting health and wellness. Ask your health care provider about: The right schedule for you to have regular tests and exams. Things you can do on your own to prevent diseases and keep yourself healthy. What should I know about diet, weight, and exercise? Eat a healthy diet  Eat a diet that includes plenty of vegetables, fruits, low-fat dairy products, and lean protein. Do not eat a lot of foods that are high in solid fats, added sugars, or sodium.  Maintain a healthy weight Body mass index (BMI) is a measurement that can be used to identify possible weight problems. It estimates body fat based on height and weight. Your health care provider can help determine your BMI and help you achieve or maintain ahealthy weight. Get regular exercise Get regular exercise. This is one of the most important things you can do for your health. Most adults should: Exercise for at least 150 minutes each week. The exercise should increase your heart rate and make you sweat (moderate-intensity exercise). Do strengthening exercises at least twice a week. This is in addition to the moderate-intensity exercise. Spend less time sitting. Even light physical activity can be beneficial. Watch cholesterol and blood lipids Have your blood tested for lipids and cholesterol at 36 years of age, then havethis test every 5 years. You may need to have your cholesterol levels checked more often if: Your lipid or cholesterol levels are high. You are older than 36 years of age. You are at high risk for heart disease. What should I know about cancer screening? Many types of cancers can be  detected early and may often be prevented. Depending on your health history and family history, you may need to have cancer screening at various ages. This may include screening for: Colorectal cancer. Prostate cancer. Skin cancer. Lung cancer. What should I know about heart disease, diabetes, and high blood pressure? Blood pressure and heart disease High blood pressure causes heart disease and increases the risk of stroke. This is more likely to develop in people who have high blood pressure readings, are of African descent, or are overweight. Talk with your health care provider about your target blood pressure readings. Have your blood pressure checked: Every 3-5 years if you are 35-44 years of age. Every year if you are 26 years old or older. If you are between the ages of 69 and 60 and are a current or former smoker, ask your health care provider if you should have a one-time screening for abdominal aortic aneurysm (AAA). Diabetes Have regular diabetes screenings. This checks your fasting blood sugar level. Have the screening done: Once every three years after age 54 if you are at a normal weight and have a low risk for diabetes. More often and at a younger age if you are overweight or have a high risk for diabetes. What should I know about preventing infection? Hepatitis B If you have a higher risk for hepatitis B, you should be screened for this virus. Talk with your health care provider to find out if you are at risk forhepatitis B infection. Hepatitis C Blood testing is recommended for: Everyone born from [birth year] through  [place] with known risk factors for hepatitis C. Sexually transmitted infections (STIs) You should be screened each year for STIs, including gonorrhea and chlamydia, if: You are sexually active and are younger than 36 years of age. You are older than 36 years of age and your health care provider tells you that you are at risk for this type of infection. Your  sexual activity has changed since you were last screened, and you are at increased risk for chlamydia or gonorrhea. Ask your health care provider if you are at risk. Ask your health care provider about whether you are at high risk for HIV. Your health care provider may recommend a prescription medicine to help prevent HIV infection. If you choose to take medicine to prevent HIV, you should first get tested for HIV. You should then be tested every 3 months for as long as you are taking the medicine. Follow these instructions at home: Lifestyle Do not use any products that contain nicotine or tobacco, such as cigarettes, e-cigarettes, and chewing tobacco. If you need help quitting, ask your health care provider. Do not use street drugs. Do not share needles. Ask your health care provider for help if you need support or information about quitting drugs. Alcohol use Do not drink alcohol if your health care provider tells you not to drink. If you drink alcohol: Limit how much you have to 0-2 drinks a day. Be aware of how much alcohol is in your drink. In the [place] one drink equals one 12 oz bottle of beer (355 mL), one 5 oz glass of wine (148 mL), or one 1 oz glass of hard liquor (44 mL). General instructions Schedule regular health, dental, and eye exams. Stay current with your vaccines. Tell your health care provider if: You often feel depressed. You have ever been abused or do not feel safe at home. Summary Adopting a healthy lifestyle and getting preventive care are important in promoting health and wellness. Follow your health care provider's instructions about healthy diet, exercising, and getting tested or screened for diseases. Follow your health care provider's instructions on monitoring your cholesterol and blood pressure. This information is not intended to replace advice given to you by your health care provider. Make sure you discuss any questions you have with your healthcare  provider. Document Revised: 04/21/2018 Document Reviewed: 04/21/2018 Elsevier Patient Education  2022 [employer].

## 2020-10-31 NOTE — Assessment & Plan Note (Signed)
1/2 ppd smoker. Encouraged full cessation. Reviewed reasons to quit as well as assistance available (NRT, wellbutrin, chantix).

## 2020-10-31 NOTE — Progress Notes (Signed)
Patient ID: Joseph Velasquez, male    DOB: 05-09-1986, 35 y.o.   MRN: [id number]  This visit was conducted in person.  BP 120/84 (BP Location: Left Arm, Patient Position: Sitting, Cuff Size: Normal)   Pulse 91   Temp 98 F (36.7 C) (Temporal)   Ht 5\' 11"  (1.803 m)   Wt 167 lb 4 oz (75.9 kg)   SpO2 97%   BMI 23.33 kg/m   BP Readings from Last 3 Encounters:  10/31/20 120/84  01/25/20 122/70  11/04/19 134/78    CC: CPE Subjective:   HPI: Joseph Velasquez is a 35 y.o. male presenting on 10/31/2020 for Annual Exam   Paxil 20mg  daily helps control mood and palpitations, 10mg  dose noted worsened palpitations.   Preventative: Colon cancer screening - not due Prostate cancer screening - not due Lung cancer screening - not due Flu shot - yearly [place] 04/2019, 05/2019, booster Pfizer  Tetanus shot - thinks UTD Pneumonia shot - not due Shingrix - not due Advanced directive discussion -  Seat belt use discussed  Sunscreen use discussed. No changing moles on skin. 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 BS in EE.  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 Interim medical history since our last visit reviewed. Allergies and medications reviewed and updated. Outpatient Medications Prior to Visit  Medication Sig Dispense Refill   PARoxetine (PAXIL) 20 MG tablet Take 1 tablet (20 mg total) by mouth daily. 90 tablet 1   No facility-administered medications prior to visit.     Per HPI unless specifically indicated in ROS section  below Review of Systems  Constitutional:  Negative for activity change, appetite change, chills, fatigue, fever and unexpected weight change.  HENT:  Negative for hearing loss.   Eyes:  Negative for visual disturbance.  Respiratory:  Negative for cough, chest tightness, shortness of breath and wheezing.   Cardiovascular:  Positive for palpitations (occasional - anxiety related). Negative for chest pain and leg swelling.  Gastrointestinal:  Positive for diarrhea (occasional). Negative for abdominal distention, abdominal pain, blood in stool, constipation, nausea and vomiting.  Genitourinary:  Negative for difficulty urinating and hematuria.  Musculoskeletal:  Negative for arthralgias, myalgias and neck pain.  Skin:  Negative for rash.  Neurological:  Negative for dizziness, seizures, syncope and headaches.  Hematological:  Negative for adenopathy. Does not bruise/bleed easily.  Psychiatric/Behavioral:  Positive for dysphoric mood. The patient is nervous/anxious.   Objective:  BP 120/84 (BP Location: Left Arm, Patient Position: Sitting, Cuff Size: Normal)   Pulse 91   Temp 98 F (36.7 C) (Temporal)   Ht 5\' 11"  (1.803 m)   Wt 167 lb 4 oz (75.9 kg)   SpO2 97%   BMI 23.33 kg/m   Wt Readings from Last 3 Encounters:  10/31/20 167 lb 4 oz (75.9 kg)  01/25/20 170 lb 4 oz (77.2 kg)  11/04/19 166 lb 4 oz (75.4 kg)      Physical Exam Vitals and nursing note reviewed.  Constitutional:  General: He is not in acute distress.    Appearance: Normal appearance. He is well-developed. He is not ill-appearing.  HENT:     Head: Normocephalic and atraumatic.     Right Ear: Hearing, tympanic membrane, ear canal and external ear normal.     Left Ear: Hearing, tympanic membrane, ear canal and external ear normal.  Eyes:     General: No scleral icterus.    Extraocular Movements: Extraocular movements intact.     Conjunctiva/sclera: Conjunctivae normal.     Pupils: Pupils are equal, round, and  reactive to light.  Neck:     Thyroid: No thyroid mass or thyromegaly.  Cardiovascular:     Rate and Rhythm: Normal rate and regular rhythm.     Pulses: Normal pulses.          Radial pulses are 2+ on the right side and 2+ on the left side.     Heart sounds: Normal heart sounds. No murmur heard. Pulmonary:     Effort: Pulmonary effort is normal. No respiratory distress.     Breath sounds: Normal breath sounds. No wheezing, rhonchi or rales.  Abdominal:     General: Bowel sounds are normal. There is no distension.     Palpations: Abdomen is soft. There is no mass.     Tenderness: There is no abdominal tenderness. There is no guarding or rebound.     Hernia: No hernia is present.  Musculoskeletal:        General: Normal range of motion.     Cervical back: Normal range of motion and neck supple.     Right lower leg: No edema.     Left lower leg: No edema.  Lymphadenopathy:     Cervical: No cervical adenopathy.  Skin:    General: Skin is warm and dry.     Findings: No rash.  Neurological:     General: No focal deficit present.     Mental Status: He is alert and oriented to person, place, and time.  Psychiatric:        Mood and Affect: Mood normal.        Behavior: Behavior normal.        Thought Content: Thought content normal.        Judgment: Judgment normal.      Results for orders placed or performed in visit on 10/26/20  Hepatitis C antibody  Result Value Ref Range   Hepatitis C Ab NON-REACTIVE NON-REACTIVE   SIGNAL TO CUT-OFF 0.02 <1.00  Comprehensive metabolic panel  Result Value Ref Range   Sodium 138 135 - 145 mEq/L   Potassium 4.0 3.5 - 5.1 mEq/L   Chloride 103 96 - 112 mEq/L   CO2 25 19 - 32 mEq/L   Glucose, Bld 105 (H) 70 - 99 mg/dL   BUN 13 6 - 23 mg/dL   Creatinine, Ser 0.77 0.40 - 1.50 mg/dL   Total Bilirubin 0.7 0.2 - 1.2 mg/dL   Alkaline Phosphatase 73 39 - 117 U/L   AST 32 0 - 37 U/L   ALT 29 0 - 53 U/L   Total Protein 7.9 6.0 - 8.3 g/dL   Albumin  4.8 3.5 - 5.2 g/dL   GFR 115.71 >60.00 mL/min   Calcium 9.9 8.4 - 10.5 mg/dL  Lipid panel  Result Value Ref Range   Cholesterol 204 (H) 0 - 200 mg/dL   Triglycerides 83.0 0.0 - 149.0 mg/dL   HDL 57.30 >39.00 mg/dL   VLDL 16.6 0.0 -  40.0 mg/dL   LDL Cholesterol 130 (H) 0 - 99 mg/dL   Total CHOL/HDL Ratio 4    NonHDL 146.21    Depression screen [facility] 2/9 10/31/2020 10/31/2020 01/25/2020 11/04/2019 09/22/2019  Decreased Interest 0 0 0 0 0  Down, Depressed, Hopeless 0 0 0 0 0  PHQ - 2 Score 0 0 0 0 0  Altered sleeping 1 - 1 2 2   Tired, decreased energy 1 - 1 2 1   Change in appetite 0 - 1 1 0  Feeling bad or failure about yourself  0 - 0 0 0  Trouble concentrating 1 - 0 0 1  Moving slowly or fidgety/restless 1 - 0 1 0  Suicidal thoughts 0 - 0 0 0  PHQ-9 Score 4 - 3 6 4   Difficult doing work/chores Not difficult at all - - - Not difficult at all    GAD 7 : Generalized Anxiety Score 10/31/2020 01/25/2020 11/04/2019 09/22/2019  Nervous, Anxious, on Edge 1 0 1 1  Control/stop worrying 0 0 0 0  Worry too much - different things 1 0 0 1  Trouble relaxing 1 1 1 1   Restless 0 1 1 1   Easily annoyed or irritable 1 0 0 0  Afraid - awful might happen 0 0 0 0  Total GAD 7 Score 4 2 3 4   Anxiety Difficulty Not difficult at all - - Not difficult at all   Assessment & Plan:  This visit occurred during the SARS-CoV-2 public health emergency.  Safety protocols were in place, including screening questions prior to the visit, additional usage of staff PPE, and extensive cleaning of exam room while observing appropriate contact time as indicated for disinfecting solutions.   Problem List Items Addressed This Visit     PVC (premature ventricular contraction)    Continue paxil 20mg . Worsening PVCs when 10mg  dose tried.        Healthcare maintenance - Primary    Preventative protocols reviewed and updated unless pt declined. Discussed healthy diet and lifestyle.        Tobacco use    1/2 ppd  smoker. Encouraged full cessation. Reviewed reasons to quit as well as assistance available (NRT, wellbutrin, chantix).        Hyperlipidemia    Chronic, off medication. Encouraged healthy diet choices to affect improved cholesterol control namely LDL. Strong fmhx CAD however he did have reassuring calcium score of zero 2020.  The ASCVD Risk score [name], et al., 2013) failed to calculate for the following reasons:   The 2013 ASCVD risk score is only valid for ages 69 to 46        Family history of heart disease   Pulmonary nodule less than 1 cm in diameter with low risk for malignant neoplasm    Rpt imaging 03/2020 reassuring no f/u needed       Adjustment disorder with anxious mood    Stable period on paxil 20mg  daily - continue.        Alcohol use    Reviewed alcohol use.          No orders of the defined types were placed in this encounter.  No orders of the defined types were placed in this encounter.   Patient instructions: Send [place] dates of PFizer booster to update your chart.  Check at home or with employee health for latest tetanus shot.  Work on [employer] choices to help control cholesterol levels.  Return as needed or in  1 year for next physical   Follow up plan: Return in about 1 year (around 10/31/2021) for annual exam, prior fasting for blood work.  Ria Bush, MD

## 2020-10-31 NOTE — Assessment & Plan Note (Signed)
Continue paxil 20mg . Worsening PVCs when 10mg  dose tried.

## 2020-10-31 NOTE — Assessment & Plan Note (Signed)
Reviewed alcohol use.

## 2020-12-05 ENCOUNTER — Encounter: Payer: Self-pay | Admitting: Family Medicine

## 2020-12-05 DIAGNOSIS — J3489 Other specified disorders of nose and nasal sinuses: Secondary | ICD-10-CM

## 2020-12-06 NOTE — Addendum Note (Signed)
Addended by: Ria Bush on: 12/06/2020 11:50 AM   Modules accepted: Orders

## 2020-12-28 ENCOUNTER — Other Ambulatory Visit: Payer: Self-pay

## 2020-12-28 DIAGNOSIS — J340 Abscess, furuncle and carbuncle of nose: Secondary | ICD-10-CM | POA: Diagnosis not present

## 2020-12-28 DIAGNOSIS — D225 Melanocytic nevi of trunk: Secondary | ICD-10-CM | POA: Diagnosis not present

## 2020-12-28 DIAGNOSIS — D485 Neoplasm of uncertain behavior of skin: Secondary | ICD-10-CM | POA: Diagnosis not present

## 2020-12-28 DIAGNOSIS — B9689 Other specified bacterial agents as the cause of diseases classified elsewhere: Secondary | ICD-10-CM | POA: Diagnosis not present

## 2020-12-28 MED ORDER — MUPIROCIN 2 % EX OINT
TOPICAL_OINTMENT | CUTANEOUS | 99 refills | Status: DC
  Filled 2020-12-28: qty 22, 30d supply, fill #0

## 2021-01-01 ENCOUNTER — Other Ambulatory Visit (HOSPITAL_COMMUNITY): Payer: Self-pay

## 2021-02-25 ENCOUNTER — Encounter: Payer: Self-pay | Admitting: Emergency Medicine

## 2021-02-25 ENCOUNTER — Other Ambulatory Visit: Payer: Self-pay

## 2021-02-25 ENCOUNTER — Ambulatory Visit
Admission: EM | Admit: 2021-02-25 | Discharge: 2021-02-25 | Disposition: A | Discharge status: Home / Self Care | Payer: 59 | Attending: Emergency Medicine | Admitting: Emergency Medicine

## 2021-02-25 DIAGNOSIS — S61412A Laceration without foreign body of left hand, initial encounter: Secondary | ICD-10-CM

## 2021-02-25 MED ORDER — TETANUS-DIPHTH-ACELL PERTUSSIS 5-2.5-18.5 LF-MCG/0.5 IM SUSY
0.5000 mL | PREFILLED_SYRINGE | Freq: Once | INTRAMUSCULAR | Status: AC
  Administered 2021-02-25: 0.5 mL via INTRAMUSCULAR

## 2021-02-25 NOTE — Discharge Instructions (Addendum)
Your tetanus was updated today.    Your stitches need to be removed in 7 to 10 days.    Keep your wound clean and dry.  Wash it gently twice a day with soap and water.  For the first 2 to 3 days, apply an antibiotic ointment and bandage.  After that, allow the wound to stay open to air.    Return here right away if you note signs of infection such as pus, fever, redness, or other concerns.

## 2021-02-25 NOTE — ED Triage Notes (Signed)
Pt puncture hand with a multi tool trimming the bottom of a door. Last tetanus unknown.

## 2021-02-25 NOTE — ED Provider Notes (Signed)
Joseph Velasquez    CSN: [id number] Arrival date & time: 02/25/21  1842      History   Chief Complaint Chief Complaint  Patient presents with   Puncture Wound    HPI Joseph Velasquez is a 35 y.o. male.  Patient presents with a laceration of his left hand which occurred 20 minutes PTA.  He was using a Photographer at home when he missed and cut the back of his hand.  Bleeding controlled with direct pressure.  He denies numbness, weakness, paresthesias, or other symptoms.  The history is provided by the patient and medical records.   Past Medical History:  Diagnosis Date   Allergies    Asthma    childhood   Depression    High cholesterol    History of depression    has seen psychiatry/psychology   Migraines    Ocular   Pulmonary nodule    Symptomatic PVCs     Patient Active Problem List   Diagnosis Date Noted   Alcohol use 11/05/2019   Ocular migraine 11/05/2019   Pulmonary nodule less than 1 cm in diameter with low risk for malignant neoplasm 03/04/2019   Adjustment disorder with anxious mood 03/04/2019   Hyperlipidemia 01/19/2019   Family history of heart disease 01/19/2019   PVC (premature ventricular contraction) 12/30/2018   Healthcare maintenance 12/30/2018   Tobacco use 12/30/2018    Past Surgical History:  Procedure Laterality Date   WISDOM TOOTH EXTRACTION Left 2009?   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 Authorizing Provider  PARoxetine (PAXIL) 20 MG tablet Take 1 tablet (20 mg total) by mouth daily. 09/26/20  Yes Ria Bush, MD  mupirocin ointment Drue Stager) 2 % Apply to nose and inside tip of nose three times a day 12/28/20   Allyn Kenner, MD    Family History Family History  Problem Relation Age of Onset   Rheum arthritis Mother    Fibromyalgia Mother    Alcohol abuse Father    Alcohol abuse Sister     Depression Sister    Drug abuse Sister    Depression Brother    Drug abuse Brother    Arthritis Maternal Grandmother    Colon cancer Maternal Grandmother    Stomach cancer Maternal Grandmother    Hearing loss Maternal Grandmother    Cancer Maternal Grandfather        mets to brain and body   Stroke Maternal Grandfather    Diabetes Paternal Grandmother    Heart disease Paternal Grandfather    Alcohol abuse Brother    Depression Brother    Drug abuse Brother    Alcohol abuse Brother    Depression Brother    High Cholesterol Brother    High blood pressure Brother    CAD Paternal Uncle 59       3v CABG   CAD Paternal Uncle 26       50v CABG    Social History Social History   Tobacco Use   Smoking status: Every Day    Packs/day: 0.50    Types: Cigarettes    Start date: 1995   Smokeless tobacco: Never  Vaping Use   Vaping Use: Never used  Substance Use Topics   Alcohol use: Yes    Comment: 3-4+ beers daily   Drug use: Never  Allergies   Food   Review of Systems Review of Systems  Constitutional:  Negative for chills and fever.  Respiratory:  Negative for cough and shortness of breath.   Cardiovascular:  Negative for chest pain and palpitations.  Skin:  Positive for wound. Negative for color change.  Neurological:  Negative for weakness and numbness.  All other systems reviewed and are negative.   Physical Exam Triage Vital Signs ED Triage Vitals  Enc Vitals Group     BP      Pulse      Resp      Temp      Temp src      SpO2      Weight      Height      Head Circumference      Peak Flow      Pain Score      Pain Loc      Pain Edu?      Excl. in [place]?    No data found.  Updated Vital Signs BP (!) 146/99 (BP Location: Left Arm)   Pulse 81   Temp 98.7 F (37.1 C) (Oral)   Resp 18   SpO2 96%   Visual Acuity Right Eye Distance:   Left Eye Distance:   Bilateral Distance:    Right Eye Near:   Left Eye Near:    Bilateral Near:      Physical Exam Vitals and nursing note reviewed.  Constitutional:      General: He is not in acute distress.    Appearance: He is well-developed.  HENT:     Head: Normocephalic and atraumatic.     Mouth/Throat:     Mouth: Mucous membranes are moist.  Eyes:     Conjunctiva/sclera: Conjunctivae normal.  Cardiovascular:     Rate and Rhythm: Normal rate and regular rhythm.     Heart sounds: Normal heart sounds.  Pulmonary:     Effort: Pulmonary effort is normal. No respiratory distress.     Breath sounds: Normal breath sounds.  Abdominal:     Palpations: Abdomen is soft.     Tenderness: There is no abdominal tenderness.  Musculoskeletal:        General: Normal range of motion.       Hands:     Cervical back: Neck supple.  Skin:    General: Skin is warm and dry.     Capillary Refill: Capillary refill takes less than 2 seconds.     Findings: Lesion present.     Comments: 3 cm laceration on dorsum of left hand.  Bleeding controlled with direct pressure.  Neurological:     General: No focal deficit present.     Mental Status: He is alert and oriented to person, place, and time.     Sensory: No sensory deficit.     Motor: No weakness.     Gait: Gait normal.  Psychiatric:        Mood and Affect: Mood normal.        Behavior: Behavior normal.     UC Treatments / Results  Labs (all labs ordered are listed, but only abnormal results are displayed) Labs Reviewed - No data to display  EKG   Radiology No results found.  Procedures Laceration Repair  Date/Time: 02/25/2021 7:29 PM Performed by: Sharion Balloon, NP Authorized by: Sharion Balloon, NP   Consent:    Consent obtained:  Verbal   Consent given by:  Patient  Risks discussed:  Infection, pain, poor cosmetic result and poor wound healing Universal protocol:    Procedure explained and questions answered to patient or proxy's satisfaction: yes   Anesthesia:    Anesthesia method:  Local infiltration   Local  anesthetic:  Lidocaine 1% w/o epi Laceration details:    Location:  Hand   Hand location:  L hand, dorsum   Length (cm):  3   Depth (mm):  2 Pre-procedure details:    Preparation:  Patient was prepped and draped in usual sterile fashion Exploration:    Hemostasis achieved with:  Direct pressure   Imaging outcome: foreign body not noted     Wound exploration: wound explored through full range of motion and entire depth of wound visualized   Treatment:    Area cleansed with:  Shur-Clens   Amount of cleaning:  Standard   Irrigation solution:  Sterile saline   Irrigation method:  Syringe   Visualized foreign bodies/material removed: no   Skin repair:    Repair method:  Sutures   Suture size:  4-0   Suture material:  Nylon   Suture technique:  Simple interrupted   Number of sutures:  3 Approximation:    Approximation:  Close Repair type:    Repair type:  Simple Post-procedure details:    Dressing:  Antibiotic ointment and non-adherent dressing   Procedure completion:  Tolerated well, no immediate complications (including critical care time)  Medications Ordered in UC Medications  Tdap (BOOSTRIX) injection 0.5 mL (has no administration in time range)    Initial Impression / Assessment and Plan / UC Course  I have reviewed the triage vital signs and the nursing notes.  Pertinent labs & imaging results that were available during my care of the patient were reviewed by me and considered in my medical decision making (see chart for details).  Laceration of left hand.  3 sutures.  Tetanus updated today.  Wound care instructions and signs of infection discussed with patient.  Instructed him to return here for suture removal in 7 to 10 days.  Instructed him to return right away if he notes signs of infection.  Education provided on laceration care.  Patient agrees to plan of care.  Final Clinical Impressions(s) / UC Diagnoses   Final diagnoses:  Laceration of left hand without  foreign body, initial encounter     Discharge Instructions      Your tetanus was updated today.    Your stitches need to be removed in 7 to 10 days.    Keep your wound clean and dry.  Wash it gently twice a day with soap and water.  For the first 2 to 3 days, apply an antibiotic ointment and bandage.  After that, allow the wound to stay open to air.    Return here right away if you note signs of infection such as pus, fever, redness, or other concerns.         ED Prescriptions   None    PDMP not reviewed this encounter.   Sharion Balloon, NP 02/25/21 [phone]

## 2021-03-18 ENCOUNTER — Other Ambulatory Visit (HOSPITAL_COMMUNITY): Payer: Self-pay

## 2021-03-18 ENCOUNTER — Other Ambulatory Visit: Payer: Self-pay | Admitting: Family Medicine

## 2021-03-18 DIAGNOSIS — F4322 Adjustment disorder with anxiety: Secondary | ICD-10-CM

## 2021-03-18 MED ORDER — PAROXETINE HCL 20 MG PO TABS
20.0000 mg | ORAL_TABLET | Freq: Every day | ORAL | 2 refills | Status: DC
  Filled 2021-03-18: qty 90, 90d supply, fill #0
  Filled 2021-06-27: qty 90, 90d supply, fill #1
  Filled 2021-09-25: qty 90, 90d supply, fill #2

## 2021-04-25 ENCOUNTER — Ambulatory Visit: Payer: 59 | Admitting: Dermatology

## 2021-06-16 IMAGING — CT CT CHEST W/O CM
1 series · 15 of 34 positions shown, 19 images · non-contrast
Comparison: Cardiac CT 02/24/2019

CLINICAL DATA: Lung nodule followup

EXAM:
CT CHEST WITHOUT CONTRAST
TECHNIQUE: Multidetector CT imaging of the chest was performed following the
standard protocol without IV contrast.

[Series 2: thorax · axial · 0.70mm/px · z∈[-707,-405]mm · 15 of 179 slices shown, 19 images]
[im 14/179  mediastinal]
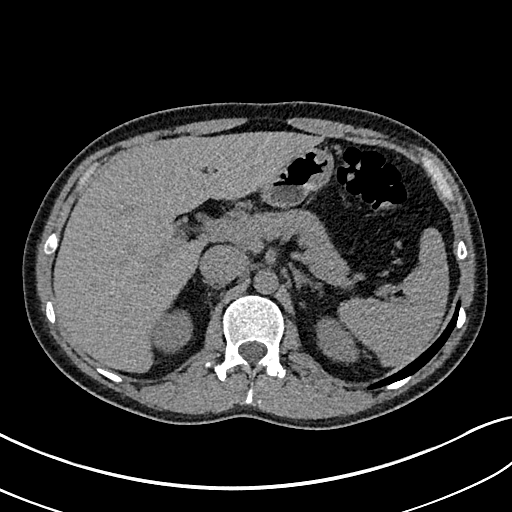
[im 14/179  lung]
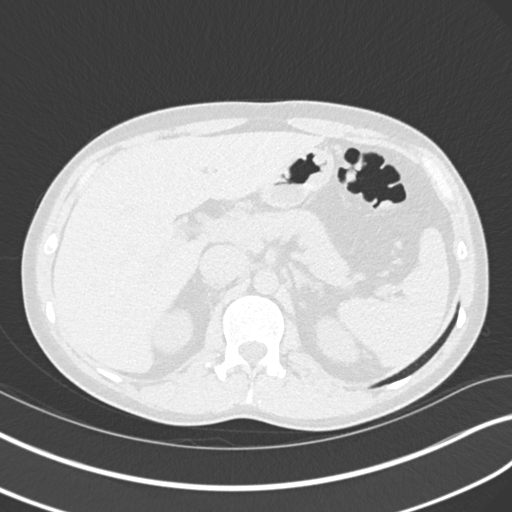
[im 27/179  lung]
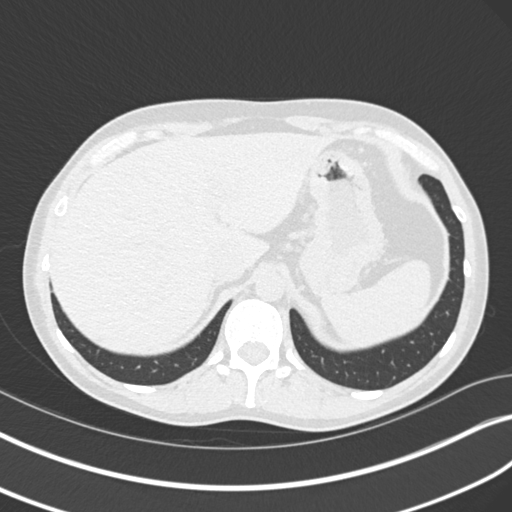
[im 36/179  lung]
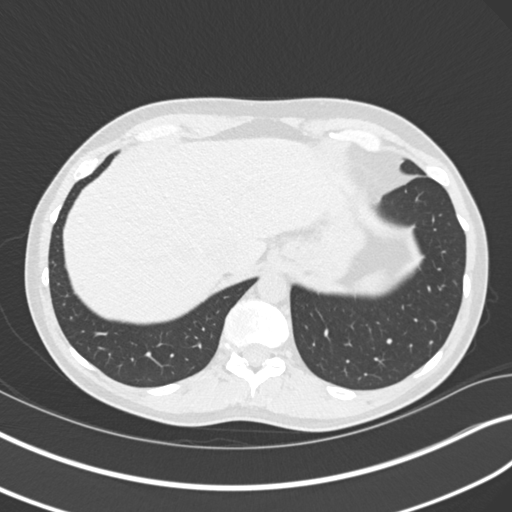
[im 47/179  lung]
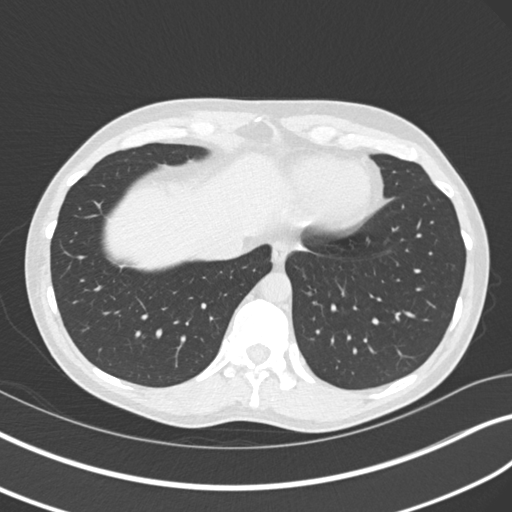
[im 60/179  mediastinal]
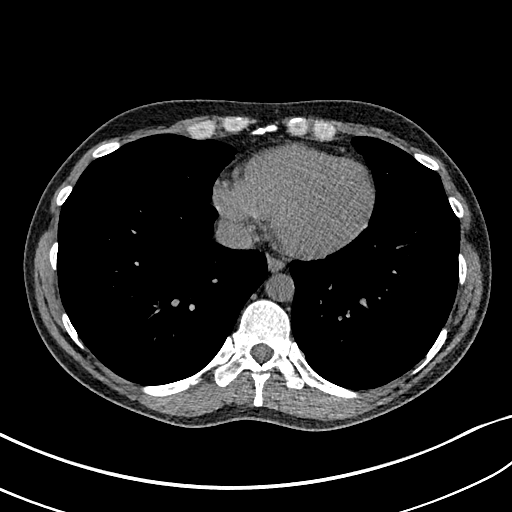
[im 60/179  lung]
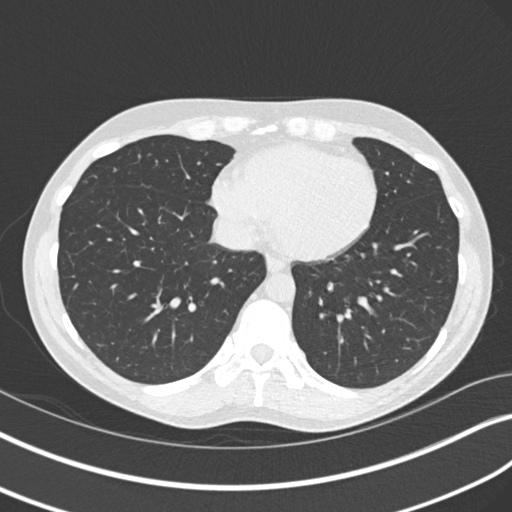
[im 72/179  lung]
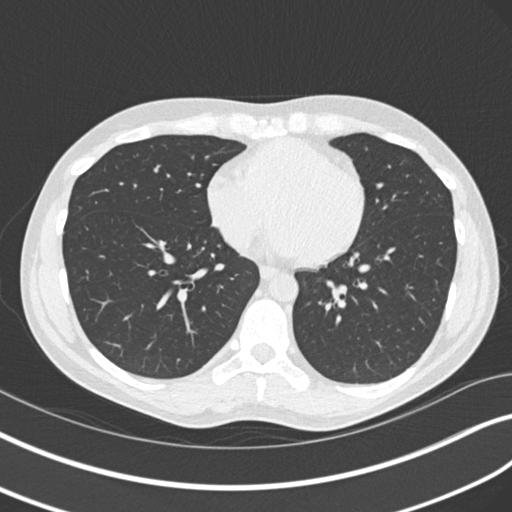
[im 80/179  lung]
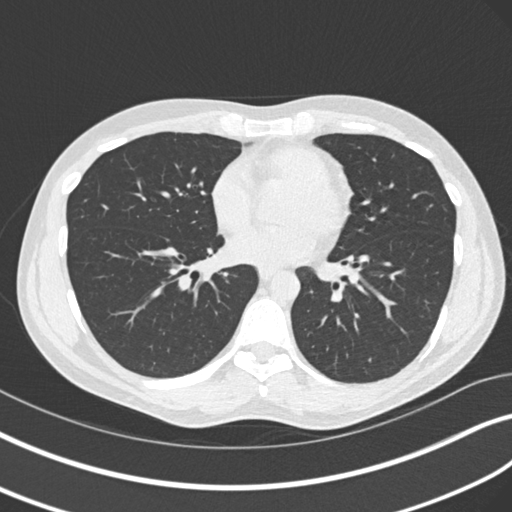
[im 93/179  lung]
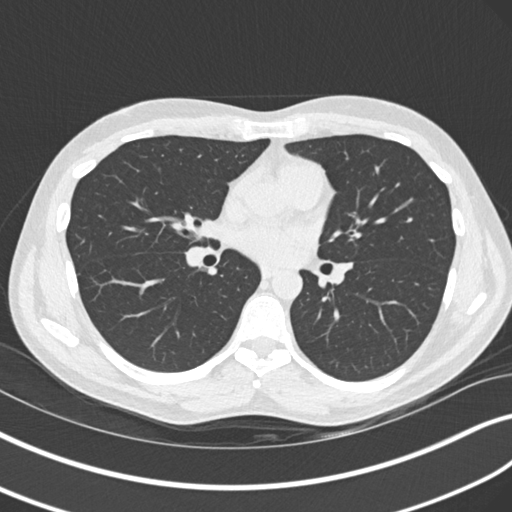
[im 99/179  mediastinal]
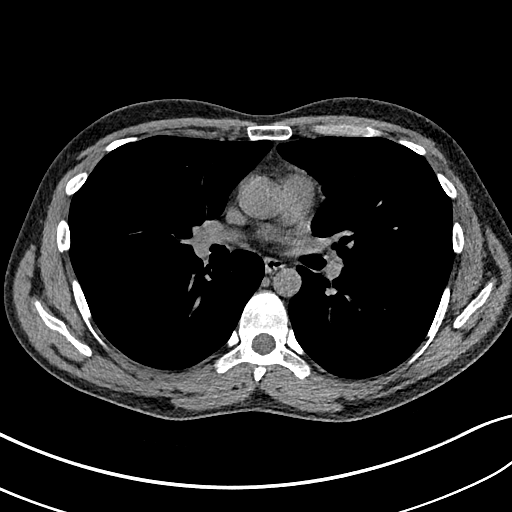
[im 99/179  lung]
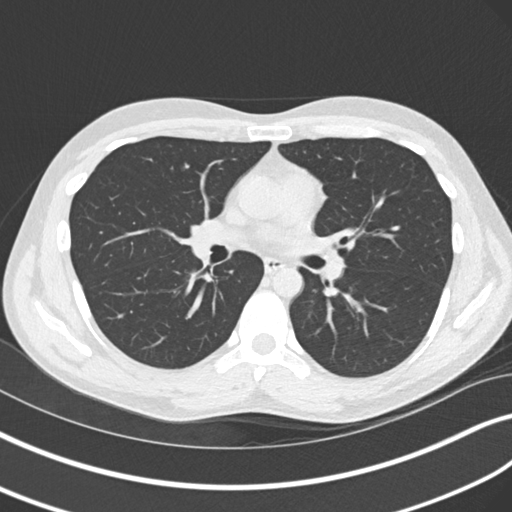
[im 107/179  lung]
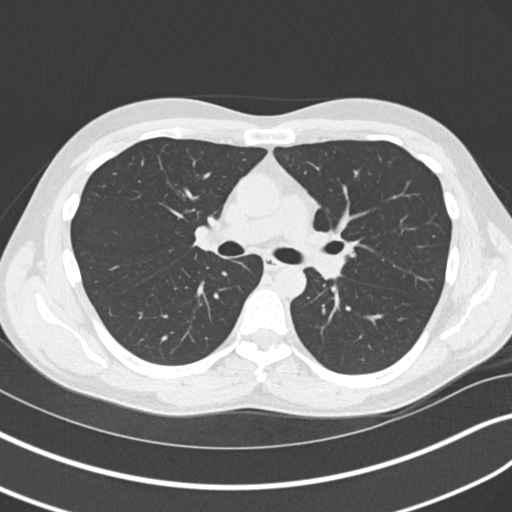
[im 119/179  lung]
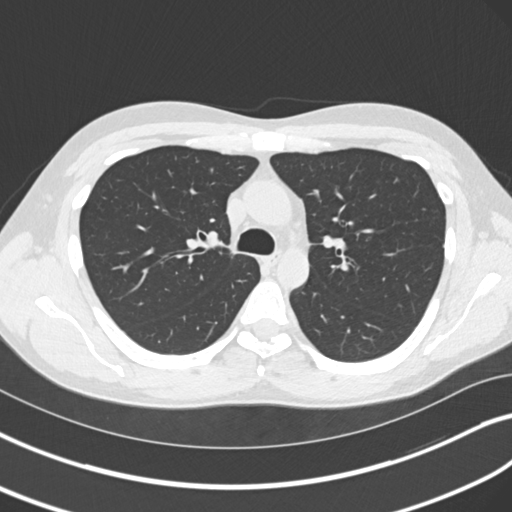
[im 132/179  lung]
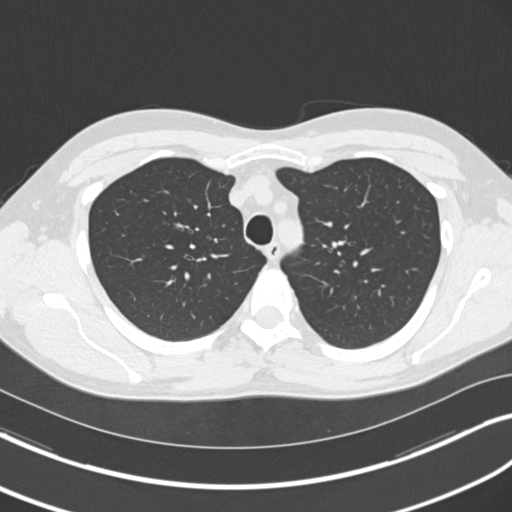
[im 143/179  mediastinal]
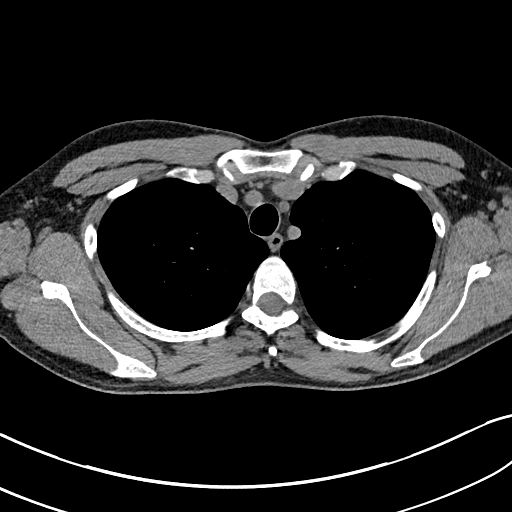
[im 143/179  lung]
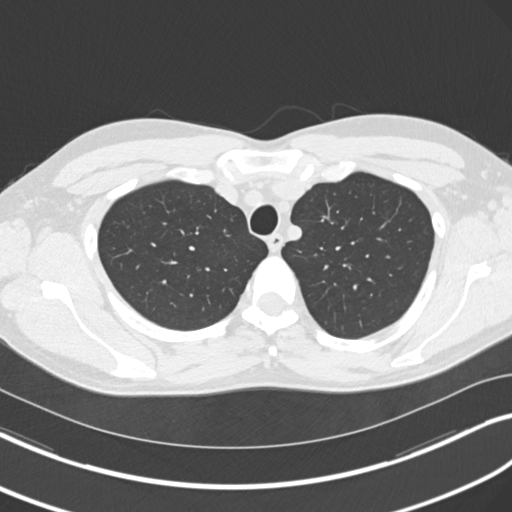
[im 152/179  lung]
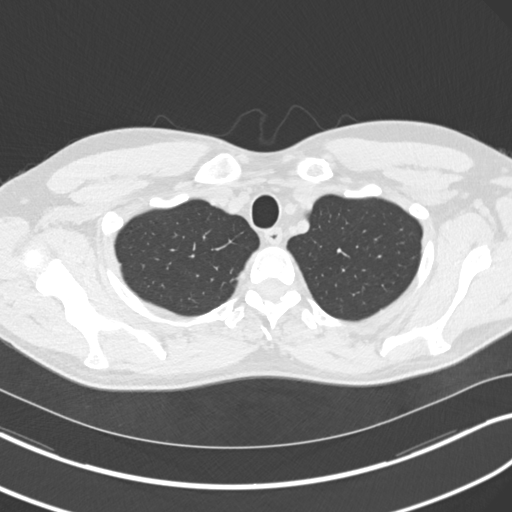
[im 165/179  lung]
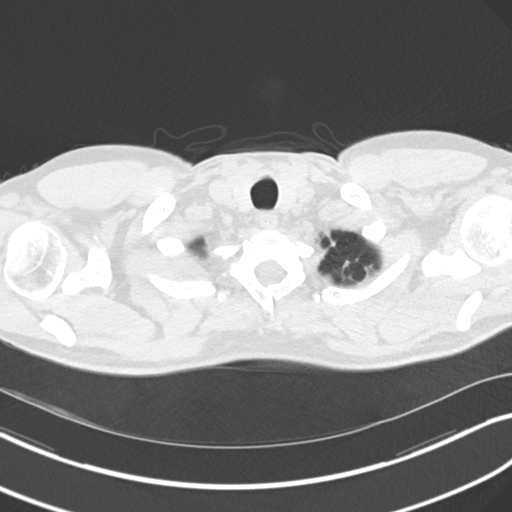

[15 of 34 positions shown; findings below may reference images not displayed]

FINDINGS: Cardiovascular: Unremarkable

Mediastinum/Nodes: Unremarkable

Lungs/Pleura: Mild biapical pleuroparenchymal scarring. Bilobed
by 0.2 cm right upper lobe pulmonary nodule on image 66 of series 3,
this level was not included on the prior cardiac CT.

Linear calcified granuloma inferiorly in the right middle lobe on
image 128 of series 3, benign.

Peripheral right lower lobe nodule 0.5 by 0.5 cm on image 118 of
series 3, no change from 02/24/2019.

Right lower lobe subpleural nodule measuring 0.4 by 0.4 cm along the
major fissure on image 110 of series 3, stable.

0.6 by 0.3 by 0.2 cm pulmonary nodule in the right lower lobe on
image 86 of series 3, corresponding to the nodule mentioned on prior
cardiac CT, no change over the past 13 months.

0.5 by 0.2 cm sub solid nodule in the left upper lobe on image 53
series 3.

Stable 0.4 by 0.2 cm sub solid nodule in the left lower lobe
anteriorly on image 105 of series 3.

Upper Abdomen: Unremarkable

Musculoskeletal: Unremarkable
IMPRESSION: 1. Stable tiny bilateral pulmonary nodules. The lesion of concern
previously noted in the right lower lobe averages about 0.4 cm in
diameter and is not changed. No further follow is required.
2. Mild biapical pleuroparenchymal scarring.

## 2021-06-28 ENCOUNTER — Other Ambulatory Visit (HOSPITAL_COMMUNITY): Payer: Self-pay

## 2021-09-19 ENCOUNTER — Other Ambulatory Visit: Payer: Self-pay

## 2021-09-25 ENCOUNTER — Other Ambulatory Visit (HOSPITAL_COMMUNITY): Payer: Self-pay

## 2021-09-30 ENCOUNTER — Encounter: Payer: Self-pay | Admitting: Family Medicine

## 2021-09-30 ENCOUNTER — Ambulatory Visit: Payer: 59 | Admitting: Family Medicine

## 2021-09-30 ENCOUNTER — Other Ambulatory Visit (HOSPITAL_COMMUNITY): Payer: Self-pay

## 2021-09-30 VITALS — BP 136/80 | HR 101 | Temp 97.9°F | Ht 71.0 in | Wt 165.0 lb

## 2021-09-30 DIAGNOSIS — F4321 Adjustment disorder with depressed mood: Secondary | ICD-10-CM | POA: Diagnosis not present

## 2021-09-30 DIAGNOSIS — F4322 Adjustment disorder with anxiety: Secondary | ICD-10-CM

## 2021-09-30 MED ORDER — PAROXETINE HCL 40 MG PO TABS
40.0000 mg | ORAL_TABLET | Freq: Every day | ORAL | 3 refills | Status: DC
  Filled 2021-09-30: qty 90, 90d supply, fill #0

## 2021-09-30 NOTE — Patient Instructions (Addendum)
Increase paxil to '40mg'$  daily.  We will refer you to a local counselor for grief counseling - call [facility] in [place] [phone], let me know if any trouble getting in and I will refer you to someone local in [place].  Keep me updated with how you're doing.

## 2021-09-30 NOTE — Progress Notes (Signed)
Patient ID: Joseph Velasquez, male    DOB: [birth year], 36 y.o.   MRN: [id number]  This visit was conducted in person.  BP 136/80   Pulse (!) 101   Temp 97.9 F (36.6 C) (Temporal)   Ht '5\' 11"'$  (1.803 m)   Wt 165 lb (74.8 kg)   SpO2 99%   BMI 23.01 kg/m    CC: discuss mood  Subjective:   HPI: Joseph Velasquez is a 36 y.o. male presenting on 09/30/2021 for Mood  (Here for f/u.  Requesting referral for mental health due to now having some issues with depression. )   Longstanding on paxil '20mg'$  daily for mood and palpitations/PVCs.   Mother passed 2 months ago, she passed away in [place] [facility] from metastatic lung cancer.   Since then notes increasing depression, anhedonia, trouble getting motivated, increasing sleep, low energy, decreased appetite. Some weight loss noted.  This is affecting job - difficulty getting out of bed due to worsening depression.   He is interested in grief counseling.  He also notes increasing palpitations, several a day.      Relevant past medical, surgical, family and social history reviewed and updated as indicated. Interim medical history since our last visit reviewed. Allergies and medications reviewed and updated. Outpatient Medications Prior to Visit  Medication Sig Dispense Refill   PARoxetine (PAXIL) 20 MG tablet Take 1 tablet (20 mg total) by mouth daily. 90 tablet 2   mupirocin ointment (BACTROBAN) 2 % Apply to nose and inside tip of nose three times a day 22 g PRN   No facility-administered medications prior to visit.     Per HPI unless specifically indicated in ROS section below Review of Systems  Objective:  BP 136/80   Pulse (!) 101   Temp 97.9 F (36.6 C) (Temporal)   Ht '5\' 11"'$  (1.803 m)   Wt 165 lb (74.8 kg)   SpO2 99%   BMI 23.01 kg/m   Wt Readings from Last 3 Encounters:  09/30/21 165 lb (74.8 kg)  10/31/20 167 lb 4 oz (75.9 kg)  01/25/20 170 lb 4 oz (77.2 kg)      Physical Exam Vitals and nursing note  reviewed.  Constitutional:      Appearance: Normal appearance. He is not ill-appearing.  Eyes:     Extraocular Movements: Extraocular movements intact.     Pupils: Pupils are equal, round, and reactive to light.  Neck:     Thyroid: No thyroid mass or thyromegaly.  Cardiovascular:     Rate and Rhythm: Normal rate and regular rhythm.     Pulses: Normal pulses.     Heart sounds: Normal heart sounds. No murmur heard. Pulmonary:     Effort: Pulmonary effort is normal. No respiratory distress.     Breath sounds: Normal breath sounds. No wheezing, rhonchi or rales.  Musculoskeletal:     Cervical back: Normal range of motion and neck supple.     Right lower leg: No edema.     Left lower leg: No edema.  Lymphadenopathy:     Cervical: No cervical adenopathy.  Skin:    General: Skin is warm and dry.     Findings: No rash.  Neurological:     Mental Status: He is alert.  Psychiatric:        Mood and Affect: Mood is depressed.        Behavior: Behavior normal.     Comments: Tearful with discussion of mother's recent passing  09/30/2021    3:32 PM 10/31/2020    3:32 PM 10/31/2020    3:25 PM 01/25/2020    3:32 PM 11/04/2019    2:23 PM  Depression screen PHQ 2/9  Decreased Interest 2 0 0 0 0  Down, Depressed, Hopeless 2 0 0 0 0  PHQ - 2 Score 4 0 0 0 0  Altered sleeping '2 1  1 2  '$ Tired, decreased energy '2 1  1 2  '$ Change in appetite 2 0  1 1  Feeling bad or failure about yourself  0 0  0 0  Trouble concentrating 1 1  0 0  Moving slowly or fidgety/restless 1 1  0 1  Suicidal thoughts 0 0  0 0  PHQ-9 Score '12 4  3 6  '$ Difficult doing work/chores Somewhat difficult Not difficult at all          09/30/2021    3:32 PM 10/31/2020    3:33 PM 01/25/2020    3:33 PM 11/04/2019    2:24 PM  GAD 7 : Generalized Anxiety Score  Nervous, Anxious, on Edge 2 1 0 1  Control/stop worrying 2 0 0 0  Worry too much - different things 2 1 0 0  Trouble relaxing '1 1 1 1  '$ Restless 2 0 1 1  Easily  annoyed or irritable 1 1 0 0  Afraid - awful might happen 0 0 0 0  Total GAD 7 Score '10 4 2 3  '$ Anxiety Difficulty Somewhat difficult Not difficult at all     Assessment & Plan:   Problem List Items Addressed This Visit     Adjustment disorder with anxious mood - Primary    Deterioration in adjustment disorder with increase in depression after mother's recent passing. Discussed natural mourning process. Do recommend referral for grief counseling given ongoing struggle affecting job performance. Will also increase paxil to '40mg'$  daily. I asked him to keep [place] updated with how he does.  Referral placed to [facility], # provided. To let me know if any difficulty scheduling appt, would then refer to local psychologist in [place].        Relevant Medications   PARoxetine (PAXIL) 40 MG tablet   Other Relevant Orders   Ambulatory referral to Psychology   Other Visit Diagnoses     Grieving       Relevant Orders   Ambulatory referral to Psychology        Meds ordered this encounter  Medications   PARoxetine (PAXIL) 40 MG tablet    Sig: Take 1 tablet (40 mg total) by mouth daily.    Dispense:  90 tablet    Refill:  3    Note higher dose   Orders Placed This Encounter  Procedures   Ambulatory referral to Psychology    Referral Priority:   Routine    Referral Type:   Psychiatric    Referral Reason:   Specialty Services Required    Requested Specialty:   Psychology    Number of Visits Requested:   1    Patient Instructions  Increase paxil to '40mg'$  daily.  We will refer you to a local counselor for grief counseling - call [facility] in [place] [phone], let me know if any trouble getting in and I will refer you to someone local in [place].  Keep me updated with how you're doing.   Follow up plan: Return if symptoms worsen or fail to improve.  Ria Bush, MD

## 2021-09-30 NOTE — Assessment & Plan Note (Addendum)
Deterioration in adjustment disorder with increase in depression after mother's recent passing. Discussed natural mourning process. Do recommend referral for grief counseling given ongoing struggle affecting job performance. Will also increase paxil to '40mg'$  daily. I asked him to keep [place] updated with how he does.  Referral placed to [facility], # provided. To let me know if any difficulty scheduling appt, would then refer to local psychologist in [place].

## 2021-10-08 ENCOUNTER — Ambulatory Visit (INDEPENDENT_AMBULATORY_CARE_PROVIDER_SITE_OTHER): Payer: 59 | Admitting: Psychology

## 2021-10-08 DIAGNOSIS — F4323 Adjustment disorder with mixed anxiety and depressed mood: Secondary | ICD-10-CM

## 2021-10-08 NOTE — Progress Notes (Signed)
[facility] Counselor Initial Adult Exam  Name: Joseph Velasquez Date: 10/08/2021 MRN: [id number] DOB: May 23, 1985 PCP: Joseph Bush, MD  Time spent: 45 mins  Guardian/Payee:  pt   Paperwork requested: No   Reason for Visit /Presenting Problem: Pt presented for session in person in our office and granted consent for the session.  Pt was referred to therapy by PCP.  Pt shares he has been more depressed recently.  No suicidal ideations  Mental Status Exam: Appearance:   Casual     Behavior:  Appropriate  Motor:  Normal  Speech/Language:   Clear and Coherent  Affect:  Appropriate  Mood:  normal  Thought process:  normal  Thought content:    WNL  Sensory/Perceptual disturbances:    WNL  Orientation:  oriented to person, place, and time/date  Attention:  Good  Concentration:  Good  Memory:  WNL  Fund of knowledge:   Good  Insight:    Good  Judgment:   Good  Impulse Control:  Good   Reported Symptoms:  Pt shares he has been feeling depressed for about the past 2 months; his mom passed away about that time; he was not there at the time she passed.  His mom had cancer for 6 months and then passed away.  His father-in-law also passed away from cancer recently as well.    Risk Assessment: Danger to Self:  No Self-injurious Behavior: No Danger to Others: No Duty to Warn:no Physical Aggression / Violence:No  Access to Firearms a concern: No  Gang Involvement:No  Patient / guardian was educated about steps to take if suicide or homicide risk level increases between visits: n/a While future psychiatric events cannot be accurately predicted, the patient does not currently require acute inpatient psychiatric care and does not currently meet [facility] involuntary commitment criteria.  Substance Abuse History: Current substance abuse: Yes ; pt shares that he may have an alcohol dependency issue; he is able to not drink if he is on call for work; his brother and  sister have been to court ordered treatment.  He shares that he has tried to slow his drinking down in March 2020; his wife drinks as well.    Past Psychiatric History:   Previous psychological history is significant for anxiety and depression Outpatient Providers:former therapist History of Psych Hospitalization: Yes ; 2008 for one week when he was in [place] in the [employer] Psychological Testing:  na    Abuse History:  Victim of: No.,  na    Report needed: No. Victim of Neglect:No. Perpetrator of  none   Witness / Exposure to Domestic Violence: No   Protective Services Involvement: No  Witness to [employer] Violence:  No   Family History:  Family History  Problem Relation Age of Onset   Rheum arthritis Mother    Fibromyalgia Mother    Alcohol abuse Father    Alcohol abuse Sister    Depression Sister    Drug abuse Sister    Depression Brother    Drug abuse Brother    Arthritis Maternal Grandmother    Colon cancer Maternal Grandmother    Stomach cancer Maternal Grandmother    Hearing loss Maternal Grandmother    Cancer Maternal Grandfather        mets to brain and body   Stroke Maternal Grandfather    Diabetes Paternal Grandmother    Heart disease Paternal Grandfather    Alcohol abuse Brother    Depression Brother  Drug abuse Brother    Alcohol abuse Brother    Depression Brother    High Cholesterol Brother    High blood pressure Brother    CAD Paternal Uncle 45       3v CABG   CAD Paternal Uncle 45       4v CABG    Living situation: the patient lives with their spouse; grew up in [place]; 3 brothers and one sister; father is still living in [place]; grew up on a farm; father did not talk about feelings much;    Sexual Orientation: Straight  Relationship Status: married  Name of spouse / other:Joseph Velasquez; married 25yr this summer; they get along well and do not argue If a parent, number of children / ages:none  Support Systems: spouse and coworker; "I don't really have a  group of friends"IT consultantStress:  No   Income/Employment/Disability: Employment  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; [place] saw a therapist there for a while  Educational History: Education: college graduate  Religion/Sprituality/World View: Agnostic  Any cultural differences that may affect / interfere with treatment:  not applicable   Recreation/Hobbies: CScience writer yard games; watching TV and movies  Stressors: Health problems   Loss of mom   Substance abuse    Strengths: Supportive Relationships-primarily wife  Barriers: Substance use and social isolation  Legal History: Pending legal issue / charges: The patient has no significant history of legal issues. History of legal issue / charges:  None  Medical History/Surgical History: reviewed Past Medical History:  Diagnosis Date   Allergies    Asthma    childhood   Depression    High cholesterol    History of depression    has seen psychiatry/psychology   Migraines    Ocular   Pulmonary nodule    Symptomatic PVCs     Past Surgical History:  Procedure Laterality Date   WISDOM TOOTH EXTRACTION Left 2009?   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 90 tablet 3   No current facility-administered medications for this visit.    Allergies  Allergen Reactions   Food Hives    Strawberries   Strawberry Extract Hives    Diagnoses:  Adjustment disorder with mixed anxiety and depressed mood  Plan of Care: Asked pt to think about self care activities and the times he might engage in them and we will meet in 2 wks for a follow up session.   KIvan Anchors [facility]

## 2021-10-22 ENCOUNTER — Ambulatory Visit (INDEPENDENT_AMBULATORY_CARE_PROVIDER_SITE_OTHER): Payer: 59 | Admitting: Psychology

## 2021-10-22 ENCOUNTER — Other Ambulatory Visit: Payer: 59

## 2021-10-22 DIAGNOSIS — F4323 Adjustment disorder with mixed anxiety and depressed mood: Secondary | ICD-10-CM | POA: Diagnosis not present

## 2021-10-22 NOTE — Progress Notes (Signed)
[facility] Counselor/Therapist Progress Note  Patient ID: YOUSOF Velasquez, MRN: [id number],    Date: 10/22/2021  Time Spent: 45 min  Treatment Type: Individual Therapy  Reported Symptoms: Pt presents in office for follow up session; granting consent for the session.  Mental Status Exam: Appearance:  Casual     Behavior: Appropriate  Motor: Normal  Speech/Language:  Clear and Coherent  Affect: Appropriate  Mood: normal  Thought process: normal  Thought content:   WNL  Sensory/Perceptual disturbances:   WNL  Orientation: oriented to person, place, and time/date  Attention: Good  Concentration: Good  Memory: WNL  Fund of knowledge:  Good  Insight:   Good  Judgment:  Good  Impulse Control: Good   Risk Assessment: Danger to Self:  No Self-injurious Behavior: No Danger to Others: No Duty to Warn:no Physical Aggression / Violence:No  Access to Firearms a concern: No  Gang Involvement:No   Subjective: Pt shares that he spent some time thinking about his alcohol use and that he needs to "cut back on how much I drink."  Pt mentions that he feels shame when he blacks out; he describes watching video of himself at night when he passes out and Joseph Velasquez is unable to wake him up to go to bed with her.  He wants to be able to wake up and go to bed with her.  Encouraged pt to count the number of beers he drinks each night and to track the alcohol content of each beer.  He prefers drinking higher alcohol content beer; he does have PBR in the refrigerator as well.  Encouraged pt to experiment with different drinking patterns that ultimately decreases the total numbers of beers that he drinks and also decreases the alcohol content for him as well.  The hope is that he will not black out and will be able to wake up and go to bed with Joseph Velasquez if he does fall asleep.  He will work on this and we will meet next week for an update.  Interventions: Cognitive Behavioral  Therapy  Diagnosis:Adjustment disorder with mixed anxiety and depressed mood  Plan: Treatment Plan Strengths/Abilities:  Intelligent, Intuitive, Willing to participate in therapy Treatment Preferences:  Outpatient Individual Therapy Statement of Needs:  Patient is to use CBT, mindfulness and coping skills to help manage and/or decrease symptoms associated with their diagnosis. Symptoms:  Depressed/Irritable mood, worry, social withdrawal Problems Addressed:  Depressive thoughts, Sadness, Sleep issues, etc. Long Term Goals:  Pt to reduce overall level, frequency, and intensity of the feelings of depression/anxiety as evidenced by decreased irritability, negative self talk, and helpless feelings from 6 to 7 days/week to 0 to 1 days/week, per client report, for at least 3 consecutive months.  Progress: 10% Short Term Goals:  Pt to verbally express understanding of the relationship between feelings of depression/anxiety and their impact on thinking patterns and behaviors.  Pt to verbalize an understanding of the role that distorted thinking plays in creating fears, excessive worry, and ruminations.  Progress: 10% Target Date:  10/23/2022 Frequency:  Bi-weekly Modality:  Cognitive Behavioral Therapy Interventions by Therapist:  Therapist will use CBT, Mindfulness exercises, Coping skills and Referrals, as needed by client. Client has verbally approved this treatment plan.  Joseph Velasquez, [facility]

## 2021-10-29 ENCOUNTER — Ambulatory Visit (INDEPENDENT_AMBULATORY_CARE_PROVIDER_SITE_OTHER): Payer: 59 | Admitting: Psychology

## 2021-10-29 ENCOUNTER — Encounter: Payer: 59 | Admitting: Family Medicine

## 2021-10-29 DIAGNOSIS — F4323 Adjustment disorder with mixed anxiety and depressed mood: Secondary | ICD-10-CM

## 2021-10-29 NOTE — Progress Notes (Signed)
[facility] Counselor/Therapist Progress Note  Patient ID: Joseph Velasquez, MRN: [id number],    Date: 10/29/2021  Time Spent: 45 min  Treatment Type: Individual Therapy  Reported Symptoms: Pt presents in office for follow up session; granting consent for the session.  Mental Status Exam: Appearance:  Casual     Behavior: Appropriate  Motor: Normal  Speech/Language:  Clear and Coherent  Affect: Appropriate  Mood: normal  Thought process: normal  Thought content:   WNL  Sensory/Perceptual disturbances:   WNL  Orientation: oriented to person, place, and time/date  Attention: Good  Concentration: Good  Memory: WNL  Fund of knowledge:  Good  Insight:   Good  Judgment:  Good  Impulse Control: Good   Risk Assessment: Danger to Self:  No Self-injurious Behavior: No Danger to Others: No Duty to Warn:no Physical Aggression / Violence:No  Access to Firearms a concern: No  Gang Involvement:No   Subjective: Pt shares that 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  He will try to get some of that time back this week because the bosses do not like for them to get OT.  Pt shares that he has been tracking his alcohol use and built a spread sheet this morning and compared his alcohol intake to regular beers.  He is also thinking about how much money he is spending on beer during the week, but is not sure what that amount is.  Pt is aware that he only drinks one beer per day on the weekends he is on call.  He would like to get to where he is only drinking about 2 beers per night, since he knows he can do it on the weekends he is on call.  Pt shares that he feels like he needs to stay busy in order to help him be successful in reducing the amount that he drinks.  Pt and [name] have a garden and they are starting to get food out of their garden (corn, sweet potatoes, carrots, peas, peppers, tomatoes, etc.).  He is building a 16 x 24  building on a concrete slab on their property.  They moved into the house in Dec 2020 and have been working on it since then; they are making good progress.  Encouraged pt to involve [name] in his process for decreasing his alcohol use; pt shares she went through a similar process for herself in the past couple of years so he believes she will be supportive of him in his effort.  Pt has been choosing to go to bed earlier in the evening so he does not fall asleep on the sofa and [name] can't wake him up.  He has not blacked out in the past week and he is proud of this accomplishment.  He will continue to pursue his goal and we will meet next week for a follow up session.  Interventions: Cognitive Behavioral Therapy  Diagnosis:Adjustment disorder with mixed anxiety and depressed mood  Plan: Treatment Plan Strengths/Abilities:  Intelligent, Intuitive, Willing to participate in therapy Treatment Preferences:  Outpatient Individual Therapy Statement of Needs:  Patient is to use CBT, mindfulness and coping skills to help manage and/or decrease symptoms associated with their diagnosis. Symptoms:  Depressed/Irritable mood, worry, social withdrawal Problems Addressed:  Depressive thoughts, Sadness, Sleep issues, etc. Long Term Goals:  Pt to reduce overall level, frequency, and intensity of the feelings of depression/anxiety as evidenced by decreased irritability, negative  self talk, and helpless feelings from 6 to 7 days/week to 0 to 1 days/week, per client report, for at least 3 consecutive months.  Progress: 10% Short Term Goals:  Pt to verbally express understanding of the relationship between feelings of depression/anxiety and their impact on thinking patterns and behaviors.  Pt to verbalize an understanding of the role that distorted thinking plays in creating fears, excessive worry, and ruminations.  Progress: 10% Target Date:  10/23/2022 Frequency:  Bi-weekly Modality:  Cognitive Behavioral  Therapy Interventions by Therapist:  Therapist will use CBT, Mindfulness exercises, Coping skills and Referrals, as needed by client. Client has verbally approved this treatment plan.  Ivan Anchors, [facility]

## 2021-11-05 ENCOUNTER — Ambulatory Visit (INDEPENDENT_AMBULATORY_CARE_PROVIDER_SITE_OTHER): Payer: 59 | Admitting: Psychology

## 2021-11-05 DIAGNOSIS — F4323 Adjustment disorder with mixed anxiety and depressed mood: Secondary | ICD-10-CM

## 2021-11-05 NOTE — Progress Notes (Signed)
[facility] Counselor/Therapist Progress Note  Patient ID: Joseph Velasquez, MRN: [id number],    Date: 11/05/2021  Time Spent: 45 min  Treatment Type: Individual Therapy  Reported Symptoms: Pt presents in office for follow up session; granting consent for the session.  Mental Status Exam: Appearance:  Casual     Behavior: Appropriate  Motor: Normal  Speech/Language:  Clear and Coherent  Affect: Appropriate  Mood: normal  Thought process: normal  Thought content:   WNL  Sensory/Perceptual disturbances:   WNL  Orientation: oriented to person, place, and time/date  Attention: Good  Concentration: Good  Memory: WNL  Fund of knowledge:  Good  Insight:   Good  Judgment:  Good  Impulse Control: Good   Risk Assessment: Danger to Self:  No Self-injurious Behavior: No Danger to Others: No Duty to Warn:no Physical Aggression / Violence:No  Access to Firearms a concern: No  Gang Involvement:No   Subjective: Pt shares that he is tired today; "I did not sleep well last night."  Pt shares that "I have been missing my mom a lot lately (mom passed away 4 months).  Pt was on call last night and tonight; he only has one beer on his on-call nights.  Pt continues to track his drinking and it has been about the same this last week; pt shares that he feels like he wants to drink less.  Pt continues to plan for how much he will drink as he begins to decrease his use.  Pt shares there garden is going well, although they have some Japenese beetles eating the leaves of their plants.  Pt shares he feels more anxious today than he has in previous sessions; "I think I need to try to figure out how to make a change for the better (with the drinking)."  Pt also shares that he is worried about failure.  We talked about being successful for today; keeping his focus more limited.  Not focusing on never drinking again or never over drinking again, but rather about managing his alcohol use today.   This idea seems to resonate with pt.  Also encouraged to to few failures as opportunities to succeed the next day.  Focus on one day at a time.  Encouraged pt to continue with his self care activities and we will meet in 2 wks for a follow up session, due to the 4th of July holiday next week.     Interventions: Cognitive Behavioral Therapy  Diagnosis:Adjustment disorder with mixed anxiety and depressed mood  Plan: Treatment Plan Strengths/Abilities:  Intelligent, Intuitive, Willing to participate in therapy Treatment Preferences:  Outpatient Individual Therapy Statement of Needs:  Patient is to use CBT, mindfulness and coping skills to help manage and/or decrease symptoms associated with their diagnosis. Symptoms:  Depressed/Irritable mood, worry, social withdrawal Problems Addressed:  Depressive thoughts, Sadness, Sleep issues, etc. Long Term Goals:  Pt to reduce overall level, frequency, and intensity of the feelings of depression/anxiety as evidenced by decreased irritability, negative self talk, and helpless feelings from 6 to 7 days/week to 0 to 1 days/week, per client report, for at least 3 consecutive months.  Progress: 10% Short Term Goals:  Pt to verbally express understanding of the relationship between feelings of depression/anxiety and their impact on thinking patterns and behaviors.  Pt to verbalize an understanding of the role that distorted thinking plays in creating fears, excessive worry, and ruminations.  Progress: 10% Target Date:  10/23/2022 Frequency:  Bi-weekly Modality:  Cognitive Behavioral Therapy  Interventions by Therapist:  Therapist will use CBT, Mindfulness exercises, Coping skills and Referrals, as needed by client. Client has verbally approved this treatment plan.  Karie Kirks, [facility]

## 2021-11-19 ENCOUNTER — Ambulatory Visit (INDEPENDENT_AMBULATORY_CARE_PROVIDER_SITE_OTHER): Payer: 59 | Admitting: Psychology

## 2021-11-19 DIAGNOSIS — F4323 Adjustment disorder with mixed anxiety and depressed mood: Secondary | ICD-10-CM | POA: Diagnosis not present

## 2021-11-19 NOTE — Progress Notes (Signed)
[facility] Counselor/Therapist Progress Note  Patient ID: Joseph Velasquez, MRN: [id number],    Date: 11/19/2021  Time Spent: 45 min  Treatment Type: Individual Therapy  Reported Symptoms: Pt presents in office for follow up session; granting consent for the session.  Mental Status Exam: Appearance:  Casual     Behavior: Appropriate  Motor: Normal  Speech/Language:  Clear and Coherent  Affect: Appropriate  Mood: normal  Thought process: normal  Thought content:   WNL  Sensory/Perceptual disturbances:   WNL  Orientation: oriented to person, place, and time/date  Attention: Good  Concentration: Good  Memory: WNL  Fund of knowledge:  Good  Insight:   Good  Judgment:  Good  Impulse Control: Good   Risk Assessment: Danger to Self:  No Self-injurious Behavior: No Danger to Others: No Duty to Warn:no Physical Aggression / Violence:No  Access to Firearms a concern: No  Gang Involvement:No   Subjective: Pt shares that he has been working a lot in the past two weeks; "nothing much is new; same ol' same ol'."  Pt shares that he drank less last week during the week and then ended up more than he wanted to drink over the weekend; "I was pretty disappointed in my self for the weekend."  Pt shares he also feels like he let [name] down this weekend as well and that carries significant emotional weight for him.  He struggles to come up with failures that he has experienced himself in his life.  Pt knows and shares that he needs to be more mindful of making better choices related to his drinking; he is continuing to keep track of how many beers he drinks per day.  Pt shares that he drank the equivalent of 9 regular beers (probably more) and 13 on Sunday.  Encouraged pt to track his intake as accurately as possible; talked about keeping a sticky note on the refrigerator that he marks every time he gets a beer out.  Talked briefly about how his excess alcohol use has negatively  impacted his intimacy with [name].  Also talked about his grief related to losing his mom 4 months ago; we need to pursue more grief work with pt on this issue.   Encouraged pt to continue with his self care activities and we will meet in 2 wks for a follow up session, due to the 4th of July holiday next week.     Interventions: Cognitive Behavioral Therapy  Diagnosis:Adjustment disorder with mixed anxiety and depressed mood  Plan: Treatment Plan Strengths/Abilities:  Intelligent, Intuitive, Willing to participate in therapy Treatment Preferences:  Outpatient Individual Therapy Statement of Needs:  Patient is to use CBT, mindfulness and coping skills to help manage and/or decrease symptoms associated with their diagnosis. Symptoms:  Depressed/Irritable mood, worry, social withdrawal Problems Addressed:  Depressive thoughts, Sadness, Sleep issues, etc. Long Term Goals:  Pt to reduce overall level, frequency, and intensity of the feelings of depression/anxiety as evidenced by decreased irritability, negative self talk, and helpless feelings from 6 to 7 days/week to 0 to 1 days/week, per client report, for at least 3 consecutive months.  Progress: 10% Short Term Goals:  Pt to verbally express understanding of the relationship between feelings of depression/anxiety and their impact on thinking patterns and behaviors.  Pt to verbalize an understanding of the role that distorted thinking plays in creating fears, excessive worry, and ruminations.  Progress: 10% Target Date:  10/23/2022 Frequency:  Bi-weekly Modality:  Cognitive Behavioral Therapy Interventions by  Therapist:  Therapist will use CBT, Mindfulness exercises, Coping skills and Referrals, as needed by client. Client has verbally approved this treatment plan.  Ivan Anchors, [facility]

## 2021-11-26 ENCOUNTER — Ambulatory Visit (INDEPENDENT_AMBULATORY_CARE_PROVIDER_SITE_OTHER): Payer: 59 | Admitting: Psychology

## 2021-11-26 DIAGNOSIS — F4323 Adjustment disorder with mixed anxiety and depressed mood: Secondary | ICD-10-CM | POA: Diagnosis not present

## 2021-11-26 NOTE — Progress Notes (Signed)
[facility] Counselor/Therapist Progress Note  Patient ID: Joseph Velasquez, MRN: [id number],    Date: 11/26/2021  Time Spent: 45 min  Treatment Type: Individual Therapy  Reported Symptoms: Pt presents in office for follow up session; granting consent for the session.  Mental Status Exam: Appearance:  Casual     Behavior: Appropriate  Motor: Normal  Speech/Language:  Clear and Coherent  Affect: Appropriate  Mood: normal  Thought process: normal  Thought content:   WNL  Sensory/Perceptual disturbances:   WNL  Orientation: oriented to person, place, and time/date  Attention: Good  Concentration: Good  Memory: WNL  Fund of knowledge:  Good  Insight:   Good  Judgment:  Good  Impulse Control: Good   Risk Assessment: Danger to Self:  No Self-injurious Behavior: No Danger to Others: No Duty to Warn:no Physical Aggression / Violence:No  Access to Firearms a concern: No  Gang Involvement:No   Subjective: Pt shares that he has "been good" since our last session.  He and [name] are taking vacation next week to visit with friends coming from [place] for the week.  Pt and [name] will then go camping the next weekend just themselves.  This will be the first time for him taking time off in a year.  Encouraged pt to take all of his vacation as he can and not to lose any at the end of each year.  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  He has been drinking less since our last session; pt drank 4-5 beers per day this past weekend.  He is proud of being able to reduce his alcohol use and is able to realize that he feels better when he drinks less.  Pt has already been planning how to drink less when the visitors are here and he feels good about that plan.  Encouraged pt to be sure to give himself credit for the good work he did this past weekend with the decreased drinking.  [name] is starting on days on 7/31 and they are both excited about that change and what  it means for them.  Pt shares that work continues about the same; no additional stressors there.  Began talking with pt about his mom as a means of beginning to work on him being able to grieve her loss.  They worked a lot together and she taught him how to work hard.  Pt shares that she and his dad worked a lot so they did not do a lot of fun things together but he loved them both.  Pt shares he misses the ability to talk with her; he allows himself to continue to talk with her, even though she is gone now.  Both of [name]'s parents have passed away.  Pt shares that he has no regrets about his mom; he is not mad at her for anything.  He wishes he could have spent more time with her but does not feels guilty about.  His father "is not sure what to do now."  Pt shares that is sister is now sober and his dad spends time with her and his brother.  Encouraged pt to think about stories of his mom that he can share with me at our next session in 2 wks.  Encouraged pt to continue with his self care activities as well.    Interventions: Cognitive Behavioral Therapy  Diagnosis:Adjustment disorder with mixed anxiety and depressed mood  Plan: Treatment Plan Strengths/Abilities:  Intelligent, Intuitive, Willing to participate in therapy Treatment Preferences:  Outpatient Individual Therapy Statement of Needs:  Patient is to use CBT, mindfulness and coping skills to help manage and/or decrease symptoms associated with their diagnosis. Symptoms:  Depressed/Irritable mood, worry, social withdrawal Problems Addressed:  Depressive thoughts, Sadness, Sleep issues, etc. Long Term Goals:  Pt to reduce overall level, frequency, and intensity of the feelings of depression/anxiety as evidenced by decreased irritability, negative self talk, and helpless feelings from 6 to 7 days/week to 0 to 1 days/week, per client report, for at least 3 consecutive months.  Progress: 10% Short Term Goals:  Pt to verbally express understanding of  the relationship between feelings of depression/anxiety and their impact on thinking patterns and behaviors.  Pt to verbalize an understanding of the role that distorted thinking plays in creating fears, excessive worry, and ruminations.  Progress: 10% Target Date:  10/23/2022 Frequency:  Bi-weekly Modality:  Cognitive Behavioral Therapy Interventions by Therapist:  Therapist will use CBT, Mindfulness exercises, Coping skills and Referrals, as needed by client. Client has verbally approved this treatment plan.  Ivan Anchors, [facility]

## 2021-11-28 ENCOUNTER — Other Ambulatory Visit: Payer: Self-pay | Admitting: Family Medicine

## 2021-11-28 DIAGNOSIS — E7800 Pure hypercholesterolemia, unspecified: Secondary | ICD-10-CM

## 2021-11-28 DIAGNOSIS — Z8249 Family history of ischemic heart disease and other diseases of the circulatory system: Secondary | ICD-10-CM

## 2021-11-29 ENCOUNTER — Other Ambulatory Visit (INDEPENDENT_AMBULATORY_CARE_PROVIDER_SITE_OTHER): Payer: 59

## 2021-11-29 DIAGNOSIS — Z8249 Family history of ischemic heart disease and other diseases of the circulatory system: Secondary | ICD-10-CM | POA: Diagnosis not present

## 2021-11-29 DIAGNOSIS — E7800 Pure hypercholesterolemia, unspecified: Secondary | ICD-10-CM | POA: Diagnosis not present

## 2021-11-29 LAB — LIPID PANEL
Cholesterol: 222 mg/dL — ABNORMAL HIGH (ref 0–200)
HDL: 77.8 mg/dL (ref >39.00)
LDL Cholesterol: 120 mg/dL — ABNORMAL HIGH (ref 0–99)
NonHDL: 144.13
Total CHOL/HDL Ratio: 3
Triglycerides: 120 mg/dL (ref 0.0–149.0)
VLDL: 24 mg/dL (ref 0.0–40.0)

## 2021-11-29 LAB — COMPREHENSIVE METABOLIC PANEL
ALT: 26 U/L (ref 0–53)
AST: 34 U/L (ref 0–37)
Albumin: 4.9 g/dL (ref 3.5–5.2)
Alkaline Phosphatase: 63 U/L (ref 39–117)
BUN: 8 mg/dL (ref 6–23)
CO2: 27 mEq/L (ref 19–32)
Calcium: 9.7 mg/dL (ref 8.4–10.5)
Chloride: 103 mEq/L (ref 96–112)
Creatinine, Ser: 0.71 mg/dL (ref 0.40–1.50)
GFR: 117.68 mL/min (ref >60.00)
Glucose, Bld: 86 mg/dL (ref 70–99)
Potassium: 4.3 mEq/L (ref 3.5–5.1)
Sodium: 140 mEq/L (ref 135–145)
Total Bilirubin: 0.3 mg/dL (ref 0.2–1.2)
Total Protein: 8.1 g/dL (ref 6.0–8.3)

## 2021-11-30 LAB — APOLIPOPROTEIN B: Apolipoprotein B: 86 mg/dL (ref <90)

## 2021-12-03 ENCOUNTER — Ambulatory Visit: Payer: 59 | Admitting: Psychology

## 2021-12-03 LAB — LIPOPROTEIN A (LPA): Lipoprotein (a): 103 nmol/L — ABNORMAL HIGH (ref <75)

## 2021-12-04 ENCOUNTER — Other Ambulatory Visit: Payer: 59

## 2021-12-10 ENCOUNTER — Ambulatory Visit (INDEPENDENT_AMBULATORY_CARE_PROVIDER_SITE_OTHER): Payer: 59 | Admitting: Psychology

## 2021-12-10 DIAGNOSIS — F4323 Adjustment disorder with mixed anxiety and depressed mood: Secondary | ICD-10-CM | POA: Diagnosis not present

## 2021-12-10 NOTE — Progress Notes (Signed)
[facility] Counselor/Therapist Progress Note  Patient ID: SEQUAN AUXIER, MRN: [id number],    Date: 12/10/2021  Time Spent: 45 min  Treatment Type: Individual Therapy  Reported Symptoms: Pt presents in office for follow up session; granting consent for the session.  Mental Status Exam: Appearance:  Casual     Behavior: Appropriate  Motor: Normal  Speech/Language:  Clear and Coherent  Affect: Appropriate  Mood: normal  Thought process: normal  Thought content:   WNL  Sensory/Perceptual disturbances:   WNL  Orientation: oriented to person, place, and time/date  Attention: Good  Concentration: Good  Memory: WNL  Fund of knowledge:  Good  Insight:   Good  Judgment:  Good  Impulse Control: Good   Risk Assessment: Danger to Self:  No Self-injurious Behavior: No Danger to Others: No Duty to Warn:no Physical Aggression / Violence:No  Access to Firearms a concern: No  Gang Involvement:No   Subjective: Pt shares that he has "been good since our last session (2 wks ago); I took last week off and that was good."  Pt enjoyed having their friends in town last week.  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  They had fun camping for the weekend.  Pt shares it was nice to have the whole week off.  His staff told him they were busy that week but they survived.  They went to [facility] during the week, went to [place] and spent time with friends at home as well.  Pt shares that he was doing pretty well with being mindful of his drinking since our last visit; he only had one day that he drank more than he intended to.  He was pleased with that performance.  It is meaningful for pt to be able to monitor and control his alcohol use.  Congratulated pt on his success in reducing the amount he is drinking; he notes that [name] has been supportive as well.  Pt has time off scheduled  8/18-8/23 to go to his sister's wedding in [place].  They actually got married in the hospital where is mom was before she passed away; this event is just the party for everyone to celebrate them.  Talked more with pt about his mom; "she was a short little red head who did not put up with much."  She did not abide foolishness from anyone; "but she was still loving.  She was a very kind person."  Pt shares his favorite things about his mom includes: they used to work together and he enjoyed that; she had a great work ethic that she taught him; she would always paint only one wall red in every home they lived in; she liked cardinals and other birds; etc.  Pt shares that he was on a video call with his father and siblings when they were at the funeral home and dad asked him if he wanted to see his mom's body.  Pt agreed although he did not want to see her there that way. We talked about how pt can replace that image of his mom with other ones he likes better (cardinal, favorite photo, etc.).  Pt shares that Victor started her new daytime work schedule yesterday and likes it a lot.  Encouraged pt to continue with his self care activities and we will meet next week for a follow up session.    Interventions: Cognitive  Behavioral Therapy  Diagnosis:Adjustment disorder with mixed anxiety and depressed mood  Plan: Treatment Plan Strengths/Abilities:  Intelligent, Intuitive, Willing to participate in therapy Treatment Preferences:  Outpatient Individual Therapy Statement of Needs:  Patient is to use CBT, mindfulness and coping skills to help manage and/or decrease symptoms associated with their diagnosis. Symptoms:  Depressed/Irritable mood, worry, social withdrawal Problems Addressed:  Depressive thoughts, Sadness, Sleep issues, etc. Long Term Goals:  Pt to reduce overall level, frequency, and intensity of the feelings of depression/anxiety as evidenced by decreased irritability, negative self talk, and helpless feelings  from 6 to 7 days/week to 0 to 1 days/week, per client report, for at least 3 consecutive months.  Progress: 10% Short Term Goals:  Pt to verbally express understanding of the relationship between feelings of depression/anxiety and their impact on thinking patterns and behaviors.  Pt to verbalize an understanding of the role that distorted thinking plays in creating fears, excessive worry, and ruminations.  Progress: 10% Target Date:  10/23/2022 Frequency:  Bi-weekly Modality:  Cognitive Behavioral Therapy Interventions by Therapist:  Therapist will use CBT, Mindfulness exercises, Coping skills and Referrals, as needed by client. Client has verbally approved this treatment plan.  Ivan Anchors, [facility]

## 2021-12-11 ENCOUNTER — Ambulatory Visit (INDEPENDENT_AMBULATORY_CARE_PROVIDER_SITE_OTHER): Payer: 59 | Admitting: Family Medicine

## 2021-12-11 ENCOUNTER — Other Ambulatory Visit (HOSPITAL_COMMUNITY): Payer: Self-pay

## 2021-12-11 ENCOUNTER — Encounter: Payer: Self-pay | Admitting: Family Medicine

## 2021-12-11 VITALS — BP 138/82 | HR 84 | Temp 97.6°F | Ht 70.5 in | Wt 155.2 lb

## 2021-12-11 DIAGNOSIS — Z0000 Encounter for general adult medical examination without abnormal findings: Secondary | ICD-10-CM | POA: Diagnosis not present

## 2021-12-11 DIAGNOSIS — Z8249 Family history of ischemic heart disease and other diseases of the circulatory system: Secondary | ICD-10-CM | POA: Diagnosis not present

## 2021-12-11 DIAGNOSIS — E7800 Pure hypercholesterolemia, unspecified: Secondary | ICD-10-CM

## 2021-12-11 DIAGNOSIS — Z720 Tobacco use: Secondary | ICD-10-CM

## 2021-12-11 DIAGNOSIS — F4322 Adjustment disorder with anxiety: Secondary | ICD-10-CM | POA: Diagnosis not present

## 2021-12-11 DIAGNOSIS — I493 Ventricular premature depolarization: Secondary | ICD-10-CM

## 2021-12-11 MED ORDER — PAROXETINE HCL 20 MG PO TABS
20.0000 mg | ORAL_TABLET | Freq: Every day | ORAL | 1 refills | Status: DC
  Filled 2021-12-11: qty 90, 90d supply, fill #0

## 2021-12-11 MED ORDER — PAROXETINE HCL 20 MG PO TABS
20.0000 mg | ORAL_TABLET | Freq: Every day | ORAL | 3 refills | Status: AC
  Filled 2021-12-11: qty 90, 90d supply, fill #0
  Filled 2022-06-02: qty 90, 90d supply, fill #1
  Filled 2022-09-03: qty 90, 90d supply, fill #2
  Filled 2022-11-26: qty 90, 90d supply, fill #3

## 2021-12-11 NOTE — Progress Notes (Signed)
Patient ID: Joseph Velasquez, male    DOB: July 03, 1985, 36 y.o.   MRN: [id number]  This visit was conducted in person.  BP 138/82   Pulse 84   Temp 97.6 F (36.4 C) (Temporal)   Ht 5' 10.5" (1.791 m)   Wt 155 lb 4 oz (70.4 kg)   SpO2 98%   BMI 21.96 kg/m    CC: CPE Subjective:   HPI: Joseph Velasquez is a 36 y.o. male presenting on 12/11/2021 for Annual Exam (Wants to discuss Paxil. )   See prior note for details. Last visit had deteriorated depressed mood/anxiety in setting of mother's passing. We increased paxil to '40mg'$  daily and referred him to counseling. Overall doing better but notes increased sweating at night time since increased paxil dose, as well as 10 lb weight loss. He has been more active this summer building a shop at home as well as hiking.   Preventative: Colon cancer screening - not due Prostate cancer screening - not due Lung cancer screening - not due Flu shot - yearly Hendron 04/2019, 05/2019, booster x1 Pfizer  Td 2007, Tdap 02/2021 Pneumonia shot - not due Shingrix - not due Advanced directive discussion -  Seat belt use discussed  Sunscreen use discussed. No changing moles on skin.  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 BS in EE.  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 Interim medical history since our last visit reviewed. Allergies and medications reviewed and updated. Outpatient Medications Prior to Visit  Medication Sig Dispense Refill   PARoxetine (PAXIL) 40 MG tablet Take 1 tablet (40 mg total) by  mouth daily. 90 tablet 3   No facility-administered medications prior to visit.     Per HPI unless specifically indicated in ROS section below Review of Systems  Constitutional:  Negative for activity change, appetite change, chills, fatigue, fever and unexpected weight change.  HENT:  Negative for hearing loss.   Eyes:  Negative for visual disturbance.  Respiratory:  Negative for cough, chest tightness, shortness of breath and wheezing.   Cardiovascular:  Negative for chest pain, palpitations (largely resolved) and leg swelling.  Gastrointestinal:  Positive for diarrhea (with greasy foods). Negative for abdominal distention, abdominal pain, blood in stool, constipation, nausea and vomiting.  Genitourinary:  Negative for difficulty urinating and hematuria.  Musculoskeletal:  Negative for arthralgias, myalgias and neck pain.  Skin:  Negative for rash.  Neurological:  Positive for headaches (occ R tension headache). Negative for dizziness, seizures and syncope.  Hematological:  Negative for adenopathy. Bruises/bleeds easily (?paxil related).  Psychiatric/Behavioral:  Negative for dysphoric mood. The patient is not nervous/anxious.        Overall improved mood    Objective:  BP 138/82   Pulse 84   Temp 97.6 F (36.4 C) (Temporal)   Ht 5' 10.5" (1.791 m)   Wt 155 lb 4 oz (70.4 kg)   SpO2 98%   BMI 21.96 kg/m   Wt Readings from Last 3 Encounters:  12/11/21 155 lb 4  oz (70.4 kg)  09/30/21 165 lb (74.8 kg)  10/31/20 167 lb 4 oz (75.9 kg)      Physical Exam Vitals and nursing note reviewed.  Constitutional:      General: He is not in acute distress.    Appearance: Normal appearance. He is well-developed. He is not ill-appearing.  HENT:     Head: Normocephalic and atraumatic.     Right Ear: Hearing, tympanic membrane, ear canal and external ear normal.     Left Ear: Hearing, tympanic membrane, ear canal and external ear normal.  Eyes:     General: No scleral icterus.     Extraocular Movements: Extraocular movements intact.     Conjunctiva/sclera: Conjunctivae normal.     Pupils: Pupils are equal, round, and reactive to light.  Neck:     Thyroid: No thyroid mass or thyromegaly.  Cardiovascular:     Rate and Rhythm: Normal rate and regular rhythm.     Pulses: Normal pulses.          Radial pulses are 2+ on the right side and 2+ on the left side.     Heart sounds: Normal heart sounds. No murmur heard. Pulmonary:     Effort: Pulmonary effort is normal. No respiratory distress.     Breath sounds: Normal breath sounds. No wheezing, rhonchi or rales.  Abdominal:     General: Bowel sounds are normal. There is no distension.     Palpations: Abdomen is soft. There is no mass.     Tenderness: There is no abdominal tenderness. There is no guarding or rebound.     Hernia: No hernia is present.  Musculoskeletal:        General: Normal range of motion.     Cervical back: Normal range of motion and neck supple.     Right lower leg: No edema.     Left lower leg: No edema.  Lymphadenopathy:     Cervical: No cervical adenopathy.  Skin:    General: Skin is warm and dry.     Findings: No rash.  Neurological:     General: No focal deficit present.     Mental Status: He is alert and oriented to person, place, and time.  Psychiatric:        Mood and Affect: Mood normal.        Behavior: Behavior normal.        Thought Content: Thought content normal.        Judgment: Judgment normal.       Results for orders placed or performed in visit on 11/29/21  Apolipoprotein B  Result Value Ref Range   Apolipoprotein B 86 <90 mg/dL  Lipoprotein A (LPA)  Result Value Ref Range   Lipoprotein (a) 103 (H) <75 nmol/L  Comprehensive metabolic panel  Result Value Ref Range   Sodium 140 135 - 145 mEq/L   Potassium 4.3 3.5 - 5.1 mEq/L   Chloride 103 96 - 112 mEq/L   CO2 27 19 - 32 mEq/L   Glucose, Bld 86 70 - 99 mg/dL   BUN 8 6 - 23 mg/dL   Creatinine, Ser 0.71 0.40 - 1.50  mg/dL   Total Bilirubin 0.3 0.2 - 1.2 mg/dL   Alkaline Phosphatase 63 39 - 117 U/L   AST 34 0 - 37 U/L   ALT 26 0 - 53 U/L   Total Protein 8.1 6.0 - 8.3 g/dL   Albumin 4.9 3.5 - 5.2 g/dL   GFR 117.68 >60.00  mL/min   Calcium 9.7 8.4 - 10.5 mg/dL  Lipid panel  Result Value Ref Range   Cholesterol 222 (H) 0 - 200 mg/dL   Triglycerides 120.0 0.0 - 149.0 mg/dL   HDL 77.80 >39.00 mg/dL   VLDL 24.0 0.0 - 40.0 mg/dL   LDL Cholesterol 120 (H) 0 - 99 mg/dL   Total CHOL/HDL Ratio 3    NonHDL 144.13       12/11/2021    3:09 PM 09/30/2021    3:32 PM 10/31/2020    3:32 PM 10/31/2020    3:25 PM 01/25/2020    3:32 PM  Depression screen PHQ 2/9  Decreased Interest 1 2 0 0 0  Down, Depressed, Hopeless 1 2 0 0 0  PHQ - 2 Score 2 4 0 0 0  Altered sleeping '2 2 1  1  '$ Tired, decreased energy '2 2 1  1  '$ Change in appetite 1 2 0  1  Feeling bad or failure about yourself  0 0 0  0  Trouble concentrating 0 1 1  0  Moving slowly or fidgety/restless 0 1 1  0  Suicidal thoughts 0 0 0  0  PHQ-9 Score '7 12 4  3  '$ Difficult doing work/chores Not difficult at all Somewhat difficult Not difficult at all        12/11/2021    3:09 PM 09/30/2021    3:32 PM 10/31/2020    3:33 PM 01/25/2020    3:33 PM  GAD 7 : Generalized Anxiety Score  Nervous, Anxious, on Edge '2 2 1 '$ 0  Control/stop worrying 0 2 0 0  Worry too much - different things '1 2 1 '$ 0  Trouble relaxing 0 '1 1 1  '$ Restless 0 2 0 1  Easily annoyed or irritable 0 1 1 0  Afraid - awful might happen 0 0 0 0  Total GAD 7 Score '3 10 4 2  '$ Anxiety Difficulty Not difficult at all Somewhat difficult Not difficult at all    Assessment & Plan:   Problem List Items Addressed This Visit     Healthcare maintenance - Primary (Chronic)    Preventative protocols reviewed and updated unless pt declined. Discussed healthy diet and lifestyle.       PVC (premature ventricular contraction)    Overall improved period.       Tobacco use    1/2 ppd smoker.  Continue  to encourage full smoking cessation as as best way to decrease cardiovascular risk.       Hyperlipidemia    Chronic, off medication, overall improved readings (HDL) with more active lifestyle. Apolipoprotein B levels reassuring however Lp(a) levels elevated in moderate risk range. Discussed implications of this on cardiovascular risk, encouraged smoking cessation to decrease future risk. He did have reassuring coronary calcium score 3 years ago.  The ASCVD Risk score (Arnett DK, et al., 2019) failed to calculate for the following reasons:   The 2019 ASCVD risk score is only valid for ages 71 to 37       Family history of heart disease    2 paternal uncles s/p CABG.       Adjustment disorder with anxious mood    Overall improved period on higher paxil '40mg'$  dose and counseling. However having side effects to paxil - will drop back to '20mg'$  daily.  To let me know if recurrent symptoms to consider cross taper onto different SSRI (lexapro vs prozac).       Relevant Medications  PARoxetine (PAXIL) 20 MG tablet     Meds ordered this encounter  Medications   DISCONTD: PARoxetine (PAXIL) 20 MG tablet    Sig: Take 1 tablet (20 mg total) by mouth daily.    Dispense:  90 tablet    Refill:  1    Note new dose   PARoxetine (PAXIL) 20 MG tablet    Sig: Take 1 tablet (20 mg total) by mouth daily.    Dispense:  90 tablet    Refill:  3    Note new dose   No orders of the defined types were placed in this encounter.   Patient instructions: Drop back down on paxil dose to '20mg'$  daily, let me know if worsening symptoms to consider tapering onto different mood medicine in same family.  Continue working on quitting smoking, healthy diet choices to keep cholesterol levels well controlled. Return as needed or in 1 year for next physical.   Follow up plan: Return in about 1 year (around 12/12/2022), or if symptoms worsen or fail to improve, for annual exam, prior fasting for blood work.  Ria Bush, MD

## 2021-12-11 NOTE — Assessment & Plan Note (Signed)
1/2 ppd smoker.  Continue to encourage full smoking cessation as as best way to decrease cardiovascular risk.

## 2021-12-11 NOTE — Patient Instructions (Addendum)
Drop back down on paxil dose to '20mg'$  daily, let me know if worsening symptoms to consider tapering onto different mood medicine in same family.  Continue working on quitting smoking, healthy diet choices to keep cholesterol levels well controlled. Return as needed or in 1 year for next physical.   Health Maintenance, Male Adopting a healthy lifestyle and getting preventive care are important in promoting health and wellness. Ask your health care provider about: The right schedule for you to have regular tests and exams. Things you can do on your own to prevent diseases and keep yourself healthy. What should I know about diet, weight, and exercise? Eat a healthy diet  Eat a diet that includes plenty of vegetables, fruits, low-fat dairy products, and lean protein. Do not eat a lot of foods that are high in solid fats, added sugars, or sodium. Maintain a healthy weight Body mass index (BMI) is a measurement that can be used to identify possible weight problems. It estimates body fat based on height and weight. Your health care provider can help determine your BMI and help you achieve or maintain a healthy weight. Get regular exercise Get regular exercise. This is one of the most important things you can do for your health. Most adults should: Exercise for at least 150 minutes each week. The exercise should increase your heart rate and make you sweat (moderate-intensity exercise). Do strengthening exercises at least twice a week. This is in addition to the moderate-intensity exercise. Spend less time sitting. Even light physical activity can be beneficial. Watch cholesterol and blood lipids Have your blood tested for lipids and cholesterol at 37 years of age, then have this test every 5 years. You may need to have your cholesterol levels checked more often if: Your lipid or cholesterol levels are high. You are older than 37 years of age. You are at high risk for heart disease. What should I  know about cancer screening? Many types of cancers can be detected early and may often be prevented. Depending on your health history and family history, you may need to have cancer screening at various ages. This may include screening for: Colorectal cancer. Prostate cancer. Skin cancer. Lung cancer. What should I know about heart disease, diabetes, and high blood pressure? Blood pressure and heart disease High blood pressure causes heart disease and increases the risk of stroke. This is more likely to develop in people who have high blood pressure readings or are overweight. Talk with your health care provider about your target blood pressure readings. Have your blood pressure checked: Every 3-5 years if you are 67-47 years of age. Every year if you are 5 years old or older. If you are between the ages of 21 and 55 and are a current or former smoker, ask your health care provider if you should have a one-time screening for abdominal aortic aneurysm (AAA). Diabetes Have regular diabetes screenings. This checks your fasting blood sugar level. Have the screening done: Once every three years after age 64 if you are at a normal weight and have a low risk for diabetes. More often and at a younger age if you are overweight or have a high risk for diabetes. What should I know about preventing infection? Hepatitis B If you have a higher risk for hepatitis B, you should be screened for this virus. Talk with your health care provider to find out if you are at risk for hepatitis B infection. Hepatitis C Blood testing is recommended for: Everyone  born from [birth year] through 1966. Anyone with known risk factors for hepatitis C. Sexually transmitted infections (STIs) You should be screened each year for STIs, including gonorrhea and chlamydia, if: You are sexually active and are younger than 37 years of age. You are older than 37 years of age and your health care provider tells you that you are at risk  for this type of infection. Your sexual activity has changed since you were last screened, and you are at increased risk for chlamydia or gonorrhea. Ask your health care provider if you are at risk. Ask your health care provider about whether you are at high risk for HIV. Your health care provider may recommend a prescription medicine to help prevent HIV infection. If you choose to take medicine to prevent HIV, you should first get tested for HIV. You should then be tested every 3 months for as long as you are taking the medicine. Follow these instructions at home: Alcohol use Do not drink alcohol if your health care provider tells you not to drink. If you drink alcohol: Limit how much you have to 0-2 drinks a day. Know how much alcohol is in your drink. In the [place] one drink equals one 12 oz bottle of beer (355 mL), one 5 oz glass of wine (148 mL), or one 1 oz glass of hard liquor (44 mL). Lifestyle Do not use any products that contain nicotine or tobacco. These products include cigarettes, chewing tobacco, and vaping devices, such as e-cigarettes. If you need help quitting, ask your health care provider. Do not use street drugs. Do not share needles. Ask your health care provider for help if you need support or information about quitting drugs. General instructions Schedule regular health, dental, and eye exams. Stay current with your vaccines. Tell your health care provider if: You often feel depressed. You have ever been abused or do not feel safe at home. Summary Adopting a healthy lifestyle and getting preventive care are important in promoting health and wellness. Follow your health care provider's instructions about healthy diet, exercising, and getting tested or screened for diseases. Follow your health care provider's instructions on monitoring your cholesterol and blood pressure. This information is not intended to replace advice given to you by your health care provider. Make  sure you discuss any questions you have with your health care provider. Document Revised: 09/17/2020 Document Reviewed: 09/17/2020 Elsevier Patient Education  [place].

## 2021-12-11 NOTE — Assessment & Plan Note (Signed)
Overall improved period on higher paxil '40mg'$  dose and counseling. However having side effects to paxil - will drop back to '20mg'$  daily.  To let me know if recurrent symptoms to consider cross taper onto different SSRI (lexapro vs prozac).

## 2021-12-11 NOTE — Assessment & Plan Note (Signed)
Preventative protocols reviewed and updated unless pt declined. Discussed healthy diet and lifestyle.  

## 2021-12-11 NOTE — Assessment & Plan Note (Signed)
2 paternal uncles s/p CABG.

## 2021-12-11 NOTE — Assessment & Plan Note (Signed)
Overall improved period.

## 2021-12-11 NOTE — Assessment & Plan Note (Signed)
Chronic, off medication, overall improved readings (HDL) with more active lifestyle. Apolipoprotein B levels reassuring however Lp(a) levels elevated in moderate risk range. Discussed implications of this on cardiovascular risk, encouraged smoking cessation to decrease future risk. He did have reassuring coronary calcium score 3 years ago.  The ASCVD Risk score (Arnett DK, et al., 2019) failed to calculate for the following reasons:   The 2019 ASCVD risk score is only valid for ages 1 to 23

## 2021-12-17 ENCOUNTER — Ambulatory Visit (INDEPENDENT_AMBULATORY_CARE_PROVIDER_SITE_OTHER): Payer: 59 | Admitting: Psychology

## 2021-12-17 DIAGNOSIS — F4323 Adjustment disorder with mixed anxiety and depressed mood: Secondary | ICD-10-CM | POA: Diagnosis not present

## 2021-12-17 NOTE — Progress Notes (Signed)
[facility] Counselor/Therapist Progress Note  Patient ID: Joseph Velasquez, MRN: [id number],    Date: 12/17/2021  Time Spent: 45 min  Treatment Type: Individual Therapy  Reported Symptoms: Pt presents in office for follow up session; granting consent for the session.  Mental Status Exam: Appearance:  Casual     Behavior: Appropriate  Motor: Normal  Speech/Language:  Clear and Coherent  Affect: Appropriate  Mood: normal  Thought process: normal  Thought content:   WNL  Sensory/Perceptual disturbances:   WNL  Orientation: oriented to person, place, and time/date  Attention: Good  Concentration: Good  Memory: WNL  Fund of knowledge:  Good  Insight:   Good  Judgment:  Good  Impulse Control: Good   Risk Assessment: Danger to Self:  No Self-injurious Behavior: No Danger to Others: No Duty to Warn:no Physical Aggression / Violence:No  Access to Firearms a concern: No  Gang Involvement:No   Subjective: Pt shares that he has "been good since our last session.  We had some friends over Saturday evening and that was fun."  Pt shares he drank more this past weekend than he had intended but there were no negative consequences for pt.  He shares he is a little disappointed that he drank more this past weekend, "because I had been making some good progress."  Talked about the benefits of giving himself grace in this area and not beating himself up.  Pt shares that he and [name] are getting home about the same time lately; they are enjoying the extra time they get to be spending together in the evenings.  Pt continues to think about his mom everyday and is trying to think more positive thoughts about her.  Talked with pt about the stages of grief and where he thinks he is in the process.  Talked about how the process is not linear and not rigidly segmented.  Pt shares his sister and brother "both put my parents through hell and I think sometimes they forget about me."  Talked with  pt about the possibility of talking to his dad more regularly to give them the chance to connect.  Pt is a little ambivalent about this option.  Talked about the fairness of the issue of pt having to reach out to his dad to stay in touch with him.  Encouraged pt to reach our to him between now and our follow up session next week.  Encouraged pt to continue with his self care activities and we will meet next week for a follow up session.    Interventions: Cognitive Behavioral Therapy  Diagnosis:Adjustment disorder with mixed anxiety and depressed mood  Plan: Treatment Plan Strengths/Abilities:  Intelligent, Intuitive, Willing to participate in therapy Treatment Preferences:  Outpatient Individual Therapy Statement of Needs:  Patient is to use CBT, mindfulness and coping skills to help manage and/or decrease symptoms associated with their diagnosis. Symptoms:  Depressed/Irritable mood, worry, social withdrawal Problems Addressed:  Depressive thoughts, Sadness, Sleep issues, etc. Long Term Goals:  Pt to reduce overall level, frequency, and intensity of the feelings of depression/anxiety as evidenced by decreased irritability, negative self talk, and helpless feelings from 6 to 7 days/week to 0 to 1 days/week, per client report, for at least 3 consecutive months.  Progress: 10% Short Term Goals:  Pt to verbally express understanding of the relationship between feelings of depression/anxiety and their impact on thinking patterns and behaviors.  Pt to verbalize an understanding of the role that distorted thinking plays in  creating fears, excessive worry, and ruminations.  Progress: 10% Target Date:  10/23/2022 Frequency:  Bi-weekly Modality:  Cognitive Behavioral Therapy Interventions by Therapist:  Therapist will use CBT, Mindfulness exercises, Coping skills and Referrals, as needed by client. Client has verbally approved this treatment plan.  Ivan Anchors, [facility]

## 2021-12-24 ENCOUNTER — Ambulatory Visit (INDEPENDENT_AMBULATORY_CARE_PROVIDER_SITE_OTHER): Payer: 59 | Admitting: Psychology

## 2021-12-24 DIAGNOSIS — F4323 Adjustment disorder with mixed anxiety and depressed mood: Secondary | ICD-10-CM

## 2021-12-24 NOTE — Progress Notes (Signed)
[place] Counselor/Therapist Progress Note  Patient ID: Joseph Velasquez, MRN: [id number],    Date: 12/24/2021  Time Spent: 45 min  Treatment Type: Individual Therapy  Reported Symptoms: Pt presents in office for follow up session; granting consent for the session.  Mental Status Exam: Appearance:  Casual     Behavior: Appropriate  Motor: Normal  Speech/Language:  Clear and Coherent  Affect: Appropriate  Mood: normal  Thought process: normal  Thought content:   WNL  Sensory/Perceptual disturbances:   WNL  Orientation: oriented to person, place, and time/date  Attention: Good  Concentration: Good  Memory: WNL  Fund of knowledge:  Good  Insight:   Good  Judgment:  Good  Impulse Control: Good   Risk Assessment: Danger to Self:  No Self-injurious Behavior: No Danger to Others: No Duty to Warn:no Physical Aggression / Violence:No  Access to Firearms a concern: No  Gang Involvement:No   Subjective: Pt shares that he talked to his dad this past Sunday.  They had a nice conversation.  He did not ask his dad to call him; it was his parents 45th wedding anniversary and he clearly missed his wife.  Pt continues to work and it is going well.  Pt and [name] are going to his sister's wedding in [place] on Friday through next week; they are also going to see [name]'s sister while they are there.  Pt shares he has been controlling his drinking "pretty well.  I am drinking about 5-6 PBR's a night.  This weekend was a little different."  Pt shares he drank more on Saturday but it was over an extended period of time.  Pt shares [name] "told me that she thinks I sometimes want to get a buzz more than I want to spend time with her."  Pt is able to agree with her.  "Sometimes I just don't pay attention and it gets away from me."  Pt shares that he and [name] do not argue about his drinking and that she drinks like he does on the weekends.  Pt shares that his mom was married before being married to  his dad.  His two oldest brothers were from her first marriage.  Pt and [name] are leaving Friday for their vacation.  Encouraged pt to consider telling his PCP about his drinking pattern to help his PCP better be able to treat his healthcare needs.  We will talk more about this at our follow up session in 2 wks, due to his vacation.  Interventions: Cognitive Behavioral Therapy  Diagnosis:Adjustment disorder with mixed anxiety and depressed mood  Plan: Treatment Plan Strengths/Abilities:  Intelligent, Intuitive, Willing to participate in therapy Treatment Preferences:  Outpatient Individual Therapy Statement of Needs:  Patient is to use CBT, mindfulness and coping skills to help manage and/or decrease symptoms associated with their diagnosis. Symptoms:  Depressed/Irritable mood, worry, social withdrawal Problems Addressed:  Depressive thoughts, Sadness, Sleep issues, etc. Long Term Goals:  Pt to reduce overall level, frequency, and intensity of the feelings of depression/anxiety as evidenced by decreased irritability, negative self talk, and helpless feelings from 6 to 7 days/week to 0 to 1 days/week, per client report, for at least 3 consecutive months.  Progress: 10% Short Term Goals:  Pt to verbally express understanding of the relationship between feelings of depression/anxiety and their impact on thinking patterns and behaviors.  Pt to verbalize an understanding of the role that distorted thinking plays in creating fears, excessive worry, and ruminations.  Progress: 10%  Target Date:  10/23/2022 Frequency:  Bi-weekly Modality:  Cognitive Behavioral Therapy Interventions by Therapist:  Therapist will use CBT, Mindfulness exercises, Coping skills and Referrals, as needed by client. Client has verbally approved this treatment plan.  Ivan Anchors, [facility]

## 2021-12-31 ENCOUNTER — Ambulatory Visit: Payer: 59 | Admitting: Psychology

## 2022-01-07 ENCOUNTER — Ambulatory Visit: Payer: 59 | Admitting: Psychology

## 2022-01-14 ENCOUNTER — Ambulatory Visit (INDEPENDENT_AMBULATORY_CARE_PROVIDER_SITE_OTHER): Payer: 59 | Admitting: Psychology

## 2022-01-14 DIAGNOSIS — F4323 Adjustment disorder with mixed anxiety and depressed mood: Secondary | ICD-10-CM

## 2022-01-14 NOTE — Progress Notes (Signed)
[facility] Counselor/Therapist Progress Note  Patient ID: Joseph Velasquez, MRN: [id number],    Date: 01/14/2022  Time Spent: 45 min  Treatment Type: Individual Therapy  Reported Symptoms: Pt presents in office for follow up session; granting consent for the session.  Mental Status Exam: Appearance:  Casual     Behavior: Appropriate  Motor: Normal  Speech/Language:  Clear and Coherent  Affect: Appropriate  Mood: normal  Thought process: normal  Thought content:   WNL  Sensory/Perceptual disturbances:   WNL  Orientation: oriented to person, place, and time/date  Attention: Good  Concentration: Good  Memory: WNL  Fund of knowledge:  Good  Insight:   Good  Judgment:  Good  Impulse Control: Good   Risk Assessment: Danger to Self:  No Self-injurious Behavior: No Danger to Others: No Duty to Warn:no Physical Aggression / Violence:No  Access to Firearms a concern: No  Gang Involvement:No   Subjective: Pt shares that he enjoyed his trip to [place] for his sister's wedding; the weather was really hot while they were there.  Pt shares he missed his mom not being there for the event.  His sister was also sad but enjoyed the wedding.  While they were in [place], they visited [name]'s sister in [place] and she was glad for that visit.  Pt shares that "I have not been doing very good with my drinking lately.  I have not been tracking it recently."  Talked with pt about his opinion of his alcohol use now versus when we started working together.  He shares that he is more aware of his use and its impact on him.  Pt shares he averaged about 6-8 beers per day this past weekend.  He did not have any alcohol the weekend before because he was on call; "That was hard; [name] was having her regular weekend."  Pt shares he wants to begin tracking his alcohol use again.  Pt has been cooking more with [name] and they both enjoy this activity.  Encouraged pt to get back to tracking his alcohol use again and  to continue thinking about what additional self care activities he might want to engage in.  We will meet next week for a follow up session.  Interventions: Cognitive Behavioral Therapy  Diagnosis:Adjustment disorder with mixed anxiety and depressed mood  Plan: Treatment Plan Strengths/Abilities:  Intelligent, Intuitive, Willing to participate in therapy Treatment Preferences:  Outpatient Individual Therapy Statement of Needs:  Patient is to use CBT, mindfulness and coping skills to help manage and/or decrease symptoms associated with their diagnosis. Symptoms:  Depressed/Irritable mood, worry, social withdrawal Problems Addressed:  Depressive thoughts, Sadness, Sleep issues, etc. Long Term Goals:  Pt to reduce overall level, frequency, and intensity of the feelings of depression/anxiety as evidenced by decreased irritability, negative self talk, and helpless feelings from 6 to 7 days/week to 0 to 1 days/week, per client report, for at least 3 consecutive months.  Progress: 10% Short Term Goals:  Pt to verbally express understanding of the relationship between feelings of depression/anxiety and their impact on thinking patterns and behaviors.  Pt to verbalize an understanding of the role that distorted thinking plays in creating fears, excessive worry, and ruminations.  Progress: 10% Target Date:  10/23/2022 Frequency:  Bi-weekly Modality:  Cognitive Behavioral Therapy Interventions by Therapist:  Therapist will use CBT, Mindfulness exercises, Coping skills and Referrals, as needed by client. Client has verbally approved this treatment plan.  Ivan Anchors, [facility]

## 2022-01-21 ENCOUNTER — Ambulatory Visit (INDEPENDENT_AMBULATORY_CARE_PROVIDER_SITE_OTHER): Payer: 59 | Admitting: Psychology

## 2022-01-21 DIAGNOSIS — F4323 Adjustment disorder with mixed anxiety and depressed mood: Secondary | ICD-10-CM

## 2022-01-21 NOTE — Progress Notes (Signed)
[place] Counselor/Therapist Progress Note  Patient ID: Joseph Velasquez, MRN: [id number],    Date: 01/21/2022  Time Spent: 45 min  Treatment Type: Individual Therapy  Reported Symptoms: Pt presents in office for follow up session; granting consent for the session.  Mental Status Exam: Appearance:  Casual     Behavior: Appropriate  Motor: Normal  Speech/Language:  Clear and Coherent  Affect: Appropriate  Mood: normal  Thought process: normal  Thought content:   WNL  Sensory/Perceptual disturbances:   WNL  Orientation: oriented to person, place, and time/date  Attention: Good  Concentration: Good  Memory: WNL  Fund of knowledge:  Good  Insight:   Good  Judgment:  Good  Impulse Control: Good   Risk Assessment: Danger to Self:  No Self-injurious Behavior: No Danger to Others: No Duty to Warn:no Physical Aggression / Violence:No  Access to Firearms a concern: No  Gang Involvement:No   Subjective: Pt shares that he has been in "a better mood since our last session."  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  Pt shares that he tends to work on [place] and x-rays.  Pt shares he has been working on his building and got all of his windows installed.  Still has to put the roof on and install the garage doors.  He will upfit the inside after he gets the building closed in.  Pt shares he continues to drink more on the weekends than he does during the week.  He shares that he drank 10 beers on Sat and 6 on Sunday.  Pt shares that during the week, he feels better because he is drinking less.  The weekends he feel more sluggish or groggy when he drinks more.  Talked about the need to monitor his alcohol intake when he is planning to work on his building this weekend.  Talked with pt about any memories of his mom that have come to him lately; asked pt to share with me 2-3 positive memories that he recalls between now and our follow up session next week.  Encouraged pt  to continue with his self care activities as well.  Interventions: Cognitive Behavioral Therapy  Diagnosis:Adjustment disorder with mixed anxiety and depressed mood  Plan: Treatment Plan Strengths/Abilities:  Intelligent, Intuitive, Willing to participate in therapy Treatment Preferences:  Outpatient Individual Therapy Statement of Needs:  Patient is to use CBT, mindfulness and coping skills to help manage and/or decrease symptoms associated with their diagnosis. Symptoms:  Depressed/Irritable mood, worry, social withdrawal Problems Addressed:  Depressive thoughts, Sadness, Sleep issues, etc. Long Term Goals:  Pt to reduce overall level, frequency, and intensity of the feelings of depression/anxiety as evidenced by decreased irritability, negative self talk, and helpless feelings from 6 to 7 days/week to 0 to 1 days/week, per client report, for at least 3 consecutive months.  Progress: 10% Short Term Goals:  Pt to verbally express understanding of the relationship between feelings of depression/anxiety and their impact on thinking patterns and behaviors.  Pt to verbalize an understanding of the role that distorted thinking plays in creating fears, excessive worry, and ruminations.  Progress: 10% Target Date:  10/23/2022 Frequency:  Bi-weekly Modality:  Cognitive Behavioral Therapy Interventions by Therapist:  Therapist will use CBT, Mindfulness exercises, Coping skills and Referrals, as needed by client. Client has verbally approved this treatment plan.  Ivan Anchors, [facility]

## 2022-01-28 ENCOUNTER — Ambulatory Visit (INDEPENDENT_AMBULATORY_CARE_PROVIDER_SITE_OTHER): Payer: 59 | Admitting: Psychology

## 2022-01-28 DIAGNOSIS — F4323 Adjustment disorder with mixed anxiety and depressed mood: Secondary | ICD-10-CM

## 2022-01-28 NOTE — Progress Notes (Signed)
[facility] Counselor/Therapist Progress Note  Patient ID: Joseph Velasquez, MRN: [id number],    Date: 01/28/2022  Time Spent: 45 min  Treatment Type: Individual Therapy  Reported Symptoms: Pt presents in office for follow up session; granting consent for the session.  Mental Status Exam: Appearance:  Casual     Behavior: Appropriate  Motor: Normal  Speech/Language:  Clear and Coherent  Affect: Appropriate  Mood: normal  Thought process: normal  Thought content:   WNL  Sensory/Perceptual disturbances:   WNL  Orientation: oriented to person, place, and time/date  Attention: Good  Concentration: Good  Memory: WNL  Fund of knowledge:  Good  Insight:   Good  Judgment:  Good  Impulse Control: Good   Risk Assessment: Danger to Self:  No Self-injurious Behavior: No Danger to Others: No Duty to Warn:no Physical Aggression / Violence:No  Access to Firearms a concern: No  Gang Involvement:No   Subjective: Pt shares that he has been fine since our last session.  Pt shares that [name]'s birthday is 9[birth year] and she will turn 36 yo; they are planning to go camping this weekend to celebrate her day.  Pt shares that his drinking has been less in the past week because he was on call this past weekend.  He got called in at 230am Sat morning and did not get home until 200pm on Saturday.  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  He was OK with taking it easy this past Sunday.  Pt shares that he and [name] cook dinner together most nights and they enjoy watching TV shows together; he also enjoys playing games on his phone and plays video games as well.  Encouraged pt to continue to engage with his self care activities and we will meet next week for a follow up session.   Interventions: Cognitive Behavioral Therapy  Diagnosis:Adjustment disorder with mixed anxiety and depressed mood  Plan: Treatment  Plan Strengths/Abilities:  Intelligent, Intuitive, Willing to participate in therapy Treatment Preferences:  Outpatient Individual Therapy Statement of Needs:  Patient is to use CBT, mindfulness and coping skills to help manage and/or decrease symptoms associated with their diagnosis. Symptoms:  Depressed/Irritable mood, worry, social withdrawal Problems Addressed:  Depressive thoughts, Sadness, Sleep issues, etc. Long Term Goals:  Pt to reduce overall level, frequency, and intensity of the feelings of depression/anxiety as evidenced by decreased irritability, negative self talk, and helpless feelings from 6 to 7 days/week to 0 to 1 days/week, per client report, for at least 3 consecutive months.  Progress: 10% Short Term Goals:  Pt to verbally express understanding of the relationship between feelings of depression/anxiety and their impact on thinking patterns and behaviors.  Pt to verbalize an understanding of the role that distorted thinking plays in creating fears, excessive worry, and ruminations.  Progress: 10% Target Date:  10/23/2022 Frequency:  Bi-weekly Modality:  Cognitive Behavioral Therapy Interventions by Therapist:  Therapist will use CBT, Mindfulness exercises, Coping skills and Referrals, as needed by client. Client has verbally approved this treatment plan.  Ivan Anchors, [facility]

## 2022-02-04 ENCOUNTER — Ambulatory Visit (INDEPENDENT_AMBULATORY_CARE_PROVIDER_SITE_OTHER): Payer: 59 | Admitting: Psychology

## 2022-02-04 DIAGNOSIS — F4323 Adjustment disorder with mixed anxiety and depressed mood: Secondary | ICD-10-CM

## 2022-02-04 NOTE — Progress Notes (Signed)
[place] Counselor/Therapist Progress Note  Patient ID: Joseph Velasquez, MRN: [id number],    Date: 02/04/2022  Time Spent: 45 min  Treatment Type: Individual Therapy  Reported Symptoms: Pt presents in office for follow up session; granting consent for the session.  Mental Status Exam: Appearance:  Casual     Behavior: Appropriate  Motor: Normal  Speech/Language:  Clear and Coherent  Affect: Appropriate  Mood: normal  Thought process: normal  Thought content:   WNL  Sensory/Perceptual disturbances:   WNL  Orientation: oriented to person, place, and time/date  Attention: Good  Concentration: Good  Memory: WNL  Fund of knowledge:  Good  Insight:   Good  Judgment:  Good  Impulse Control: Good   Risk Assessment: Danger to Self:  No Self-injurious Behavior: No Danger to Others: No Duty to Warn:no Physical Aggression / Violence:No  Access to Firearms a concern: No  Gang Involvement:No   Subjective: Pt shares that he has been fine since our last session.  Pt shares that he and [name] spent the weekend camping and went to a couple of really nice places to celebrate her birthday.  She enjoyed the weekend.  Pt has continued to manage his drinking to acceptable levels.  They took their dogs with them (3rd time taking them camping).  Pt has been playing video games and spending time with [name] for self care activities; he has also worked on his shop.  They are going to [place] this weekend and going to the [employer] football game; he is excited about the weekend.  [name] is OK with turning 36 yo and had a nice celebration.  They have lots of weekend plans for several weekends in a row and pt is looking forward to those.  Pt shares that he has already thought about how much he  will be drinking this coming weekend and has a good plan to manage it appropriately; pt seems pleased that he has planned in advance.  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  He will be working at [employer] during Nov; he lives in [place] so that is convenient for him.  Pt shares he has not been sleeping well recently; trouble falling asleep and staying asleep.  Talked pt through the process of using deep breathing to help with this issue.  Encouraged pt to practice daily to develop the skill.  Encouraged pt to continue to engage with his self care activities and we will meet next week for a follow up session.   Interventions: Cognitive Behavioral Therapy  Diagnosis:Adjustment disorder with mixed anxiety and depressed mood  Plan: Treatment Plan Strengths/Abilities:  Intelligent, Intuitive, Willing to participate in therapy Treatment Preferences:  Outpatient Individual Therapy Statement of Needs:  Patient is to use CBT, mindfulness and coping skills to help manage and/or decrease symptoms associated with their diagnosis. Symptoms:  Depressed/Irritable mood, worry, social withdrawal Problems Addressed:  Depressive thoughts, Sadness, Sleep issues, etc. Long Term Goals:  Pt to reduce overall level, frequency, and intensity of the feelings of depression/anxiety as evidenced by decreased irritability, negative self talk, and helpless feelings from 6 to 7 days/week to 0 to 1 days/week, per client report, for at least 3 consecutive months.  Progress: 10% Short Term Goals:  Pt to verbally express understanding of the relationship between feelings of depression/anxiety and their impact on thinking patterns and behaviors.  Pt to verbalize an understanding of the role that distorted thinking plays in creating fears, excessive  worry, and ruminations.  Progress: 10% Target Date:  10/23/2022 Frequency:  Bi-weekly Modality:  Cognitive Behavioral Therapy Interventions by Therapist:  Therapist will use CBT, Mindfulness exercises, Coping skills and Referrals, as needed by client. Client has verbally approved this treatment plan.  Ivan Anchors,  [place], [facility]

## 2022-02-18 ENCOUNTER — Ambulatory Visit (INDEPENDENT_AMBULATORY_CARE_PROVIDER_SITE_OTHER): Payer: 59 | Admitting: Psychology

## 2022-02-18 DIAGNOSIS — F4323 Adjustment disorder with mixed anxiety and depressed mood: Secondary | ICD-10-CM | POA: Diagnosis not present

## 2022-02-18 NOTE — Progress Notes (Signed)
[place] Counselor/Therapist Progress Note  Patient ID: Joseph Velasquez, MRN: [id number],    Date: 02/18/2022  Time Spent: 45 min  Treatment Type: Individual Therapy  Reported Symptoms: Pt presents in office for follow up session; granting consent for the session.  Mental Status Exam: Appearance:  Casual     Behavior: Appropriate  Motor: Normal  Speech/Language:  Clear and Coherent  Affect: Appropriate  Mood: normal  Thought process: normal  Thought content:   WNL  Sensory/Perceptual disturbances:   WNL  Orientation: oriented to person, place, and time/date  Attention: Good  Concentration: Good  Memory: WNL  Fund of knowledge:  Good  Insight:   Good  Judgment:  Good  Impulse Control: Good   Risk Assessment: Danger to Self:  No Self-injurious Behavior: No Danger to Others: No Duty to Warn:no Physical Aggression / Violence:No  Access to Firearms a concern: No  Gang Involvement:No   Subjective: Pt shares that he has been fine since our last session.  Pt shares that he and [name] spent the weekend in [place] for the [place] vs[place] [employer] football game and had fun there.  "There were so many people there."  Pt shares that work has been busy, especially last week.  Pt shares that he tends not to bring work stress home.  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  He was on call this past weekend but they set up some Halloween decorations at "our favorite local bottle shop and that was fun." Pt shares that he drank more in [place] than he planned to; he felt bad about that and [name] drank more than pt did.  He has bought some non-alcoholic beer to "mix in with the real stuff."  Pt shares that he had to replace the alternator on his car this past weekend.  They have a persimmon tree in their back yard and they use the fruit for pies and jams.  Pt shares that he and [name] continue to watch favorite TV shows and movies.  Pt shares that he talked with his oldest brother who is  now off of house arrest but pt is afraid his is drinking again; he does have a job now so pt is hopeful about that.  Another brother had to have an MRI due to back trouble (this is the brother who got addicted to opiates and Meth and has been clean for a couple of years).  Pt shares they continue to spend time with their dogs and they enjoy that.  This weekend they are going to the beach to fish with a coworker and pt is looking forward to the weekend; [name] and the coworker's wife are friends too.  Pt shares that his sleeping has been better with the deep breathing; encouraged pt to continue to practice deep breathing a couple of times per day.  Encouraged pt to continue to engage with his self care activities and we will meet next week for a follow up session.   Interventions: Cognitive Behavioral Therapy  Diagnosis:Adjustment disorder with mixed anxiety and depressed mood  Plan: Treatment Plan Strengths/Abilities:  Intelligent, Intuitive, Willing to participate in therapy Treatment Preferences:  Outpatient Individual Therapy Statement of Needs:  Patient is to use CBT, mindfulness and coping skills to help manage and/or decrease symptoms associated with their diagnosis. Symptoms:  Depressed/Irritable mood, worry, social withdrawal Problems Addressed:  Depressive thoughts, Sadness, Sleep issues, etc. Long Term Goals:  Pt to reduce overall level, frequency, and intensity of the feelings of  depression/anxiety as evidenced by decreased irritability, negative self talk, and helpless feelings from 6 to 7 days/week to 0 to 1 days/week, per client report, for at least 3 consecutive months.  Progress: 10% Short Term Goals:  Pt to verbally express understanding of the relationship between feelings of depression/anxiety and their impact on thinking patterns and behaviors.  Pt to verbalize an understanding of the role that distorted thinking plays in creating fears, excessive worry, and ruminations.  Progress:  10% Target Date:  10/23/2022 Frequency:  Bi-weekly Modality:  Cognitive Behavioral Therapy Interventions by Therapist:  Therapist will use CBT, Mindfulness exercises, Coping skills and Referrals, as needed by client. Client has verbally approved this treatment plan.  Ivan Anchors, [facility]

## 2022-02-25 ENCOUNTER — Ambulatory Visit: Payer: 59 | Admitting: Psychology

## 2022-02-27 DIAGNOSIS — H5213 Myopia, bilateral: Secondary | ICD-10-CM | POA: Diagnosis not present

## 2022-03-04 ENCOUNTER — Ambulatory Visit: Payer: 59 | Admitting: Psychology

## 2022-03-11 ENCOUNTER — Ambulatory Visit: Payer: 59 | Admitting: Psychology

## 2022-03-18 ENCOUNTER — Ambulatory Visit (INDEPENDENT_AMBULATORY_CARE_PROVIDER_SITE_OTHER): Payer: 59 | Admitting: Psychology

## 2022-03-18 DIAGNOSIS — F4323 Adjustment disorder with mixed anxiety and depressed mood: Secondary | ICD-10-CM

## 2022-03-18 NOTE — Progress Notes (Signed)
[place] Counselor/Therapist Progress Note  Patient ID: Joseph Velasquez, MRN: [id number],    Date: 03/18/2022  Time Spent: 45 min  Treatment Type: Individual Therapy  Reported Symptoms: Pt presents in office for follow up session; granting consent for the session.  Mental Status Exam: Appearance:  Casual     Behavior: Appropriate  Motor: Normal  Speech/Language:  Clear and Coherent  Affect: Appropriate  Mood: normal  Thought process: normal  Thought content:   WNL  Sensory/Perceptual disturbances:   WNL  Orientation: oriented to person, place, and time/date  Attention: Good  Concentration: Good  Memory: WNL  Fund of knowledge:  Good  Insight:   Good  Judgment:  Good  Impulse Control: Good   Risk Assessment: Danger to Self:  No Self-injurious Behavior: No Danger to Others: No Duty to Warn:no Physical Aggression / Violence:No  Access to Firearms a concern: No  Gang Involvement:No   Subjective: Pt shares that "he has been alright since our last session 4 wks ago."  He shares that he has almost gotten his building finished; asked pt to bring photos to our next session.  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  Pt shares that his dad had a heart attack on Sunday and had a stent placed.  He got out of the hospital by Tuesday and is feeling OK.  His sister (who is one year sober) lives about a half hour away and takes him some food from time to time.  His drove himself to the hospital and he got a stent right away.   Pt would like to go see him over the holidays but [name] has to work Christmas Day and he is on call that day as well.  He is hopeful that his dad might be willing to come here for Christmas.  Pt would like to be able to go see his dad sometime soon.   Pt shares that he drank a little more than he wanted to due to the stress related to worrying about his dad,  "but I am getting it back in line."  Pt shares that they enjoyed going to the beach a few weeks ago to fish; "we did not catch anything, but it was still fun.  We went to [facility] and walked around and that was fun."  Pt continues to engage in activities that are good for him.  Pt shares that his sleep is pretty good since our last session.  Pt continues to use his deep breathing to help him fall asleep.  Encouraged pt to continue to engage with his self care activities and we will meet in 2 weeks for a follow up session.   Interventions: Cognitive Behavioral Therapy  Diagnosis:Adjustment disorder with mixed anxiety and depressed mood  Plan: Treatment Plan Strengths/Abilities:  Intelligent, Intuitive, Willing to participate in therapy Treatment Preferences:  Outpatient Individual Therapy Statement of Needs:  Patient is to use CBT, mindfulness and coping skills to help manage and/or decrease symptoms associated with their diagnosis. Symptoms:  Depressed/Irritable mood, worry, social withdrawal Problems Addressed:  Depressive thoughts, Sadness, Sleep issues, etc. Long Term Goals:  Pt to reduce overall level, frequency, and intensity of the feelings of depression/anxiety as evidenced by decreased irritability, negative self talk, and helpless feelings from 6 to 7 days/week to 0 to 1 days/week, per client report, for at least 3 consecutive months.  Progress: 10% Short Term Goals:  Pt to verbally express understanding of the relationship between feelings of depression/anxiety and their impact on thinking patterns and behaviors.  Pt to verbalize an understanding of the role that distorted thinking plays in creating fears, excessive worry, and ruminations.  Progress: 10% Target Date:  10/23/2022 Frequency:  Bi-weekly Modality:  Cognitive Behavioral Therapy Interventions by Therapist:  Therapist will use CBT, Mindfulness exercises, Coping skills and Referrals, as needed by client. Client has verbally  approved this treatment plan.  Ivan Anchors, [facility]

## 2022-03-25 ENCOUNTER — Ambulatory Visit: Payer: 59 | Admitting: Psychology

## 2022-04-01 ENCOUNTER — Ambulatory Visit: Payer: 59 | Admitting: Psychology

## 2022-04-01 ENCOUNTER — Ambulatory Visit (INDEPENDENT_AMBULATORY_CARE_PROVIDER_SITE_OTHER): Payer: 59 | Admitting: Psychology

## 2022-04-01 DIAGNOSIS — F4323 Adjustment disorder with mixed anxiety and depressed mood: Secondary | ICD-10-CM

## 2022-04-01 NOTE — Progress Notes (Signed)
[place] Counselor/Therapist Progress Note  Patient ID: JOSTIN RUE, MRN: [id number],    Date: 04/01/2022  Time Spent: 45 min  Treatment Type: Individual Therapy  Reported Symptoms: Pt presents in office for follow up session; granting consent for the session.  Mental Status Exam: Appearance:  Casual     Behavior: Appropriate  Motor: Normal  Speech/Language:  Clear and Coherent  Affect: Appropriate  Mood: normal  Thought process: normal  Thought content:   WNL  Sensory/Perceptual disturbances:   WNL  Orientation: oriented to person, place, and time/date  Attention: Good  Concentration: Good  Memory: WNL  Fund of knowledge:  Good  Insight:   Good  Judgment:  Good  Impulse Control: Good   Risk Assessment: Danger to Self:  No Self-injurious Behavior: No Danger to Others: No Duty to Warn:no Physical Aggression / Violence:No  Access to Firearms a concern: No  Gang Involvement:No   Subjective: Pt shares that "he has been good since our last session.  I have my building completed, except for the garage doors."  Pt hopes to get it completed this weekend.  Pt shares his family had thought about coming down here for Thanksgiving but it did not work out.  He shares his family got together last weekend and pt did not know about it; he would have liked it if they would have FaceTimed with him during the event.  Pt is frustrated about being excluded from those kinds of events.  Pt is missing his mom at this time because she would be the one to make sure he was included, via FaceTime.  Encouraged pt to be intentional about reaching out to family members for holidays and other family times.  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  Encouraged pt to continue to engage with his self  care activities and we will meet in 2 weeks for a follow up session.   Interventions: Cognitive Behavioral Therapy  Diagnosis:Adjustment disorder with mixed anxiety and depressed mood  Plan: Treatment Plan Strengths/Abilities:  Intelligent, Intuitive, Willing to participate in therapy Treatment Preferences:  Outpatient Individual Therapy Statement of Needs:  Patient is to use CBT, mindfulness and coping skills to help manage and/or decrease symptoms associated with their diagnosis. Symptoms:  Depressed/Irritable mood, worry, social withdrawal Problems Addressed:  Depressive thoughts, Sadness, Sleep issues, etc. Long Term Goals:  Pt to reduce overall level, frequency, and intensity of the feelings of depression/anxiety as evidenced by decreased irritability, negative self talk, and helpless feelings from 6 to 7 days/week to 0 to 1 days/week, per client report, for at least 3 consecutive months.  Progress: 10% Short Term Goals:  Pt to verbally express understanding of the relationship between feelings of depression/anxiety and their impact on thinking patterns and behaviors.  Pt to verbalize an understanding of the role that distorted thinking plays in creating fears, excessive worry, and ruminations.  Progress: 10% Target Date:  10/23/2022 Frequency:  Bi-weekly Modality:  Cognitive Behavioral Therapy Interventions by Therapist:  Therapist will use CBT, Mindfulness exercises, Coping skills and Referrals, as needed by client. Client has verbally approved this treatment plan.  Ivan Anchors, [facility]

## 2022-04-08 ENCOUNTER — Ambulatory Visit: Payer: 59 | Admitting: Psychology

## 2022-04-15 ENCOUNTER — Ambulatory Visit (INDEPENDENT_AMBULATORY_CARE_PROVIDER_SITE_OTHER): Payer: 59 | Admitting: Psychology

## 2022-04-15 DIAGNOSIS — F4323 Adjustment disorder with mixed anxiety and depressed mood: Secondary | ICD-10-CM | POA: Diagnosis not present

## 2022-04-15 NOTE — Progress Notes (Signed)
[place] Counselor/Therapist Progress Note  Patient ID: Joseph Velasquez, MRN: [id number],    Date: 04/15/2022  Time Spent: 45 min  Treatment Type: Individual Therapy  Reported Symptoms: Pt presents in office for follow up session; granting consent for the session.  Mental Status Exam: Appearance:  Casual     Behavior: Appropriate  Motor: Normal  Speech/Language:  Clear and Coherent  Affect: Appropriate  Mood: normal  Thought process: normal  Thought content:   WNL  Sensory/Perceptual disturbances:   WNL  Orientation: oriented to person, place, and time/date  Attention: Good  Concentration: Good  Memory: WNL  Fund of knowledge:  Good  Insight:   Good  Judgment:  Good  Impulse Control: Good   Risk Assessment: Danger to Self:  No Self-injurious Behavior: No Danger to Others: No Duty to Warn:no Physical Aggression / Violence:No  Access to Firearms a concern: No  Gang Involvement:No   Subjective: Pt shares that "he has been good since our last session.  Thanksgiving was quiet and we put up the Christmas tree on Sunday; I wasn't really in the Christmas spirit this year with the tree, because of my mom not being here."  Pt shares he talked with his dad and sister on Thanksgiving and those were good conversations.  Pt shares that he has "continued having mostly nonalcoholic beers, with occasional regular beers sprinkled in."  Pt shares that he feels good about this change and does not feel like he is missing out on anything.  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  They are going to [facility] for a friend's [place]ap Year birthday in 2024 and will also visit pt's cousin who lives there.  Pt shares that he has had some frustration with moving things into his building but he took a break until  the next day and felt better.  Asked pt to spend time between now and our follow up session in 2 wks thinking about memories of his mom and seeing what things he can recall and we will talk about those next session.  Encouraged pt to continue to engage with his self care activities and we will meet in 2 weeks for a follow up session.   Interventions: Cognitive Behavioral Therapy  Diagnosis:Adjustment disorder with mixed anxiety and depressed mood  Plan: Treatment Plan Strengths/Abilities:  Intelligent, Intuitive, Willing to participate in therapy Treatment Preferences:  Outpatient Individual Therapy Statement of Needs:  Patient is to use CBT, mindfulness and coping skills to help manage and/or decrease symptoms associated with their diagnosis. Symptoms:  Depressed/Irritable mood, worry, social withdrawal Problems Addressed:  Depressive thoughts, Sadness, Sleep issues, etc. Long Term Goals:  Pt to reduce overall level, frequency, and intensity of the feelings of depression/anxiety as evidenced by decreased irritability, negative self talk, and helpless feelings from 6 to 7 days/week to 0 to 1 days/week, per client report, for at least 3 consecutive months.  Progress: 10% Short Term Goals:  Pt to verbally express understanding of the relationship between feelings of depression/anxiety and their impact on thinking patterns and behaviors.  Pt to verbalize an understanding of the role that distorted thinking plays in creating fears, excessive worry, and ruminations.  Progress: 10% Target Date:  10/23/2022 Frequency:  Bi-weekly Modality:  Cognitive Behavioral Therapy Interventions  by Therapist:  Therapist will use CBT, Mindfulness exercises, Coping skills and Referrals, as needed by client. Client has verbally approved this treatment plan.  Ivan Anchors, [facility]

## 2022-04-29 ENCOUNTER — Ambulatory Visit (INDEPENDENT_AMBULATORY_CARE_PROVIDER_SITE_OTHER): Payer: 59 | Admitting: Psychology

## 2022-04-29 DIAGNOSIS — F4323 Adjustment disorder with mixed anxiety and depressed mood: Secondary | ICD-10-CM | POA: Diagnosis not present

## 2022-04-29 NOTE — Progress Notes (Signed)
[place] Counselor/Therapist Progress Note  Patient ID: Joseph Velasquez, MRN: [id number],    Date: 04/29/2022  Time Spent: 45 min  Treatment Type: Individual Therapy  Reported Symptoms: Pt presents in office for follow up session; granting consent for the session.  Mental Status Exam: Appearance:  Casual     Behavior: Appropriate  Motor: Normal  Speech/Language:  Clear and Coherent  Affect: Appropriate  Mood: normal  Thought process: normal  Thought content:   WNL  Sensory/Perceptual disturbances:   WNL  Orientation: oriented to person, place, and time/date  Attention: Good  Concentration: Good  Memory: WNL  Fund of knowledge:  Good  Insight:   Good  Judgment:  Good  Impulse Control: Good   Risk Assessment: Danger to Self:  No Self-injurious Behavior: No Danger to Others: No Duty to Warn:no Physical Aggression / Violence:No  Access to Firearms a concern: No  Gang Involvement:No   Subjective: Pt shares that "I drank more than I had been drinking on Saturday and Sunday I felt terrible.  I try to control how much I drink but I did not do a good job of it on Saturday."  Talked with pt about how he felt about his overuse of alcohol.  He shares that he felt physically bad and emotionally frustrated with himself.  Pt shares that his frustration makes him feel badly about himself.  Talked with pt about combating his negative self talk and how to do that.  Encouraged pt to track his progress, if possible and we will talk more about this topic in our follow up session in 2 wks.  Also encouraged pt to continue with his self care activities between now and then.   Interventions: Cognitive Behavioral Therapy  Diagnosis:Adjustment disorder with mixed anxiety and depressed mood  Plan: Treatment Plan Strengths/Abilities:  Intelligent, Intuitive, Willing to participate in therapy Treatment Preferences:  Outpatient Individual Therapy Statement of Needs:  Patient is to  use CBT, mindfulness and coping skills to help manage and/or decrease symptoms associated with their diagnosis. Symptoms:  Depressed/Irritable mood, worry, social withdrawal Problems Addressed:  Depressive thoughts, Sadness, Sleep issues, etc. Long Term Goals:  Pt to reduce overall level, frequency, and intensity of the feelings of depression/anxiety as evidenced by decreased irritability, negative self talk, and helpless feelings from 6 to 7 days/week to 0 to 1 days/week, per client report, for at least 3 consecutive months.  Progress: 10% Short Term Goals:  Pt to verbally express understanding of the relationship between feelings of depression/anxiety and their impact on thinking patterns and behaviors.  Pt to verbalize an understanding of the role that distorted thinking plays in creating fears, excessive worry, and ruminations.  Progress: 10% Target Date:  10/23/2022 Frequency:  Bi-weekly Modality:  Cognitive Behavioral Therapy Interventions by Therapist:  Therapist will use CBT, Mindfulness exercises, Coping skills and Referrals, as needed by client. Client has verbally approved this treatment plan.  Ivan Anchors, [facility]

## 2022-05-13 ENCOUNTER — Ambulatory Visit (INDEPENDENT_AMBULATORY_CARE_PROVIDER_SITE_OTHER): Payer: 59 | Admitting: Psychology

## 2022-05-13 DIAGNOSIS — F4323 Adjustment disorder with mixed anxiety and depressed mood: Secondary | ICD-10-CM | POA: Diagnosis not present

## 2022-05-13 NOTE — Progress Notes (Signed)
[place] Counselor/Therapist Progress Note  Patient ID: Joseph Velasquez, MRN: [id number],    Date: 05/13/2022  Time Spent: 45 min  Treatment Type: Individual Therapy  Reported Symptoms: Pt presents in office for follow up session; granting consent for the session.  Mental Status Exam: Appearance:  Casual     Behavior: Appropriate  Motor: Normal  Speech/Language:  Clear and Coherent  Affect: Appropriate  Mood: normal  Thought process: normal  Thought content:   WNL  Sensory/Perceptual disturbances:   WNL  Orientation: oriented to person, place, and time/date  Attention: Good  Concentration: Good  Memory: WNL  Fund of knowledge:  Good  Insight:   Good  Judgment:  Good  Impulse Control: Good   Risk Assessment: Danger to Self:  No Self-injurious Behavior: No Danger to Others: No Duty to Warn:no Physical Aggression / Violence:No  Access to Firearms a concern: No  Gang Involvement:No   Subjective: Pt shares that "Christmas eve was kind of tough; I was missing mom and missing family; it will never be the same."  Talked with pt about allowing himself to grieve the loss of his mom so that he can celebrate his mom rather than trying to avoid thinking about it.  Pt will try to allow himself to do this and we will talk about his experience in our follow up session in 2 wks.  Pt shares that he and Joseph Velasquez are doing well.  They had a nice holiday, except for working.  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  Pt shares that his drinking behavior has been better, even over the holiday.  Congratulated pt on making good decisions about what and when to drink.  Pt shares that he has not been as frustrated with himself because of his better choices around drinking.  Encouraged pt to continue with his self care activities between now and our follow up session in 2 wks.   Interventions: Cognitive  Behavioral Therapy  Diagnosis:Adjustment disorder with mixed anxiety and depressed mood  Plan: Treatment Plan Strengths/Abilities:  Intelligent, Intuitive, Willing to participate in therapy Treatment Preferences:  Outpatient Individual Therapy Statement of Needs:  Patient is to use CBT, mindfulness and coping skills to help manage and/or decrease symptoms associated with their diagnosis. Symptoms:  Depressed/Irritable mood, worry, social withdrawal Problems Addressed:  Depressive thoughts, Sadness, Sleep issues, etc. Long Term Goals:  Pt to reduce overall level, frequency, and intensity of the feelings of depression/anxiety as evidenced by decreased irritability, negative self talk, and helpless feelings from 6 to 7 days/week to 0 to 1 days/week, per client report, for at least 3 consecutive months.  Progress: 10% Short Term Goals:  Pt to verbally express understanding of the relationship between feelings of depression/anxiety and their impact on thinking patterns and behaviors.  Pt to verbalize an understanding of the role that distorted thinking plays in creating fears, excessive worry, and ruminations.  Progress: 10% Target Date:  10/23/2022 Frequency:  Bi-weekly Modality:  Cognitive Behavioral Therapy Interventions by Therapist:  Therapist will use CBT, Mindfulness exercises, Coping skills and Referrals, as needed by client. Client has verbally approved this treatment plan.  Ivan Anchors, [facility]

## 2022-05-27 ENCOUNTER — Ambulatory Visit: Payer: 59 | Admitting: Psychology

## 2022-05-27 ENCOUNTER — Ambulatory Visit: Payer: Self-pay | Admitting: Psychology

## 2022-06-10 ENCOUNTER — Ambulatory Visit (INDEPENDENT_AMBULATORY_CARE_PROVIDER_SITE_OTHER): Payer: 59 | Admitting: Psychology

## 2022-06-10 DIAGNOSIS — F4323 Adjustment disorder with mixed anxiety and depressed mood: Secondary | ICD-10-CM

## 2022-06-10 NOTE — Progress Notes (Signed)
[place] Counselor/Therapist Progress Note  Patient ID: Joseph Velasquez, MRN: [id number],    Date: 06/10/2022  Time Spent: 45 min  Treatment Type: Individual Therapy  Reported Symptoms: Pt presents in office for follow up session; granting consent for the session.  Mental Status Exam: Appearance:  Casual     Behavior: Appropriate  Motor: Normal  Speech/Language:  Clear and Coherent  Affect: Appropriate  Mood: normal  Thought process: normal  Thought content:   WNL  Sensory/Perceptual disturbances:   WNL  Orientation: oriented to person, place, and time/date  Attention: Good  Concentration: Good  Memory: WNL  Fund of knowledge:  Good  Insight:   Good  Judgment:  Good  Impulse Control: Good   Risk Assessment: Danger to Self:  No Self-injurious Behavior: No Danger to Others: No Duty to Warn:no Physical Aggression / Violence:No  Access to Firearms a concern: No  Gang Involvement:No   Subjective: Pt shares that "I learned a lot at my training in [place] a couple of weeks ago.  It was a two week training process; first week was online training and the second week was hands on."  Pt shares he was not on call for any of those two weeks but has been on call more for the past 2 wks, making up for lost time.  Pt shares that [name] is doing well and they have been good; they made pretzels and cheese this past weekend. Pt is planning to cook for Tamm for Valentine's Day.  They have a pretty good division of labor within their relationship.  Pt shares things have been pretty good for him since our last session.  They are going to [facility] at the end of the month and they are going to stop in [place] on their way back home.  He is not looking forward to the reason for the visit but he is looking forward to seeing his family.  His dad is doing OK and pt is looking forward to seeing him.  Pt shares he has been better with his drinking behavior; [name] has not said anything negative to  him about it in the month since our last session.  Encouraged pt to continue with his self care activities between now and our follow up session in 2 wks.   Interventions: Cognitive Behavioral Therapy  Diagnosis:Adjustment disorder with mixed anxiety and depressed mood  Plan: Treatment Plan Strengths/Abilities:  Intelligent, Intuitive, Willing to participate in therapy Treatment Preferences:  Outpatient Individual Therapy Statement of Needs:  Patient is to use CBT, mindfulness and coping skills to help manage and/or decrease symptoms associated with their diagnosis. Symptoms:  Depressed/Irritable mood, worry, social withdrawal Problems Addressed:  Depressive thoughts, Sadness, Sleep issues, etc. Long Term Goals:  Pt to reduce overall level, frequency, and intensity of the feelings of depression/anxiety as evidenced by decreased irritability, negative self talk, and helpless feelings from 6 to 7 days/week to 0 to 1 days/week, per client report, for at least 3 consecutive months.  Progress: 10% Short Term Goals:  Pt to verbally express understanding of the relationship between feelings of depression/anxiety and their impact on thinking patterns and behaviors.  Pt to verbalize an understanding of the role that distorted thinking plays in creating fears, excessive worry, and ruminations.  Progress: 10% Target Date:  10/23/2022 Frequency:  Bi-weekly Modality:  Cognitive Behavioral Therapy Interventions by Therapist:  Therapist will use CBT, Mindfulness exercises, Coping skills and Referrals, as needed by client. Client has verbally approved this treatment  plan.  Ivan Anchors, [facility]

## 2022-06-24 ENCOUNTER — Ambulatory Visit (INDEPENDENT_AMBULATORY_CARE_PROVIDER_SITE_OTHER): Payer: 59 | Admitting: Psychology

## 2022-06-24 DIAGNOSIS — F4323 Adjustment disorder with mixed anxiety and depressed mood: Secondary | ICD-10-CM | POA: Diagnosis not present

## 2022-06-24 NOTE — Progress Notes (Signed)
Joseph Velasquez Counselor/Therapist Progress Note  Patient ID: Joseph Velasquez, MRN: [id number],    Date: 06/24/2022  Time Spent: 60 min  Treatment Type: Individual Therapy  Reported Symptoms: Pt presents in office for follow up session; granting consent for the session.  Mental Status Exam: Appearance:  Casual     Behavior: Appropriate  Motor: Normal  Speech/Language:  Clear and Coherent  Affect: Appropriate  Mood: normal  Thought process: normal  Thought content:   WNL  Sensory/Perceptual disturbances:   WNL  Orientation: oriented to person, place, and time/date  Attention: Good  Concentration: Good  Memory: WNL  Fund of knowledge:  Good  Insight:   Good  Judgment:  Good  Impulse Control: Good   Risk Assessment: Danger to Self:  No Self-injurious Behavior: No Danger to Others: No Duty to Warn:no Physical Aggression / Violence:No  Access to Firearms a concern: No  Gang Involvement:No   Subjective: Pt shares that "Life is about the same as usual."  They have been out to eat recently and enjoyed that; they also worked in the yard some as well.  Pt shares that work is going well; they are still short staffed.  Pt shares his dad is doing OK; he is looking forward to seeing his dad soon; they leave on their trip on 2/29 and will come back on 3/10.  Pt is managing the workload at work well; "I learned a long time ago to leave work at work."  [name] is doing well and they are both looking forward to their vacation.  Pt shares that he drank more this past weekend than he wanted to; talked to pt about thinking through his thoughts and feelings about why he ended up drinking more than he had planned.  We will review his thoughts next session.  Pt is still planning to cook for Tamm for Valentine's Day; he will do it Thurs because he is on call tomorrow night.  Pt shares that he and Tamm have planned their annual vacation for his birthday in August; they have been there 3 times in  the past.  Pt shares that his brother was in the hospital last week for acute alcohol intoxication; his brother has been in that situation several times before.  Encouraged pt to continue with his self care activities between now and our follow up session in 2 wks.   Interventions: Cognitive Behavioral Therapy  Diagnosis:Adjustment disorder with mixed anxiety and depressed mood  Plan: Treatment Plan Strengths/Abilities:  Intelligent, Intuitive, Willing to participate in therapy Treatment Preferences:  Outpatient Individual Therapy Statement of Needs:  Patient is to use CBT, mindfulness and coping skills to help manage and/or decrease symptoms associated with their diagnosis. Symptoms:  Depressed/Irritable mood, worry, social withdrawal Problems Addressed:  Depressive thoughts, Sadness, Sleep issues, etc. Long Term Goals:  Pt to reduce overall level, frequency, and intensity of the feelings of depression/anxiety as evidenced by decreased irritability, negative self talk, and helpless feelings from 6 to 7 days/week to 0 to 1 days/week, per client report, for at least 3 consecutive months.  Progress: 10% Short Term Goals:  Pt to verbally express understanding of the relationship between feelings of depression/anxiety and their impact on thinking patterns and behaviors.  Pt to verbalize an understanding of the role that distorted thinking plays in creating fears, excessive worry, and ruminations.  Progress: 10% Target Date:  10/23/2022 Frequency:  Bi-weekly Modality:  Cognitive Behavioral Therapy Interventions by Therapist:  Therapist will use CBT, Mindfulness exercises,  Coping skills and Referrals, as needed by client. Client has verbally approved this treatment plan.  Ivan Anchors, [facility]

## 2022-06-27 ENCOUNTER — Telehealth: Payer: 59 | Admitting: Physician Assistant

## 2022-06-27 ENCOUNTER — Other Ambulatory Visit: Payer: Self-pay

## 2022-06-27 DIAGNOSIS — L247 Irritant contact dermatitis due to plants, except food: Secondary | ICD-10-CM

## 2022-06-27 MED ORDER — TRIAMCINOLONE ACETONIDE 0.1 % EX CREA
1.0000 | TOPICAL_CREAM | Freq: Two times a day (BID) | CUTANEOUS | 0 refills | Status: DC
  Filled 2022-06-27: qty 30, 15d supply, fill #0

## 2022-06-27 MED ORDER — PREDNISONE 10 MG PO TABS
ORAL_TABLET | ORAL | 0 refills | Status: AC
  Filled 2022-06-27: qty 37, 14d supply, fill #0

## 2022-06-27 NOTE — Progress Notes (Signed)

## 2022-06-27 NOTE — Progress Notes (Signed)
I have spent 5 minutes in review of e-visit questionnaire, review and updating patient chart, medical decision making and response to patient.   Dollie Mayse Cody Yuvonne Lanahan, PA-C    

## 2022-06-27 NOTE — Addendum Note (Signed)
Addended by: Brunetta Jeans on: 06/27/2022 07:59 AM   Modules accepted: Orders

## 2022-07-08 ENCOUNTER — Ambulatory Visit (INDEPENDENT_AMBULATORY_CARE_PROVIDER_SITE_OTHER): Payer: 59 | Admitting: Psychology

## 2022-07-08 DIAGNOSIS — F4323 Adjustment disorder with mixed anxiety and depressed mood: Secondary | ICD-10-CM | POA: Diagnosis not present
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
## 2022-07-22 ENCOUNTER — Ambulatory Visit: Payer: 59 | Admitting: Psychology

## 2022-08-05 ENCOUNTER — Ambulatory Visit (INDEPENDENT_AMBULATORY_CARE_PROVIDER_SITE_OTHER): Payer: 59 | Admitting: Psychology

## 2022-08-05 DIAGNOSIS — F4323 Adjustment disorder with mixed anxiety and depressed mood: Secondary | ICD-10-CM | POA: Diagnosis not present

## 2022-08-05 NOTE — Progress Notes (Signed)
[place] Counselor/Therapist Progress Note  Patient ID: Joseph Velasquez, MRN: [id number],    Date: 08/05/2022  Time Spent: 45 min  Treatment Type: Individual Therapy  Reported Symptoms: Pt presents in office for follow up session; granting consent for the session.  Mental Status Exam: Appearance:  Casual     Behavior: Appropriate  Motor: Normal  Speech/Language:  Clear and Coherent  Affect: Appropriate  Mood: normal  Thought process: normal  Thought content:   WNL  Sensory/Perceptual disturbances:   WNL  Orientation: oriented to person, place, and time/date  Attention: Good  Concentration: Good  Memory: WNL  Fund of knowledge:  Good  Insight:   Good  Judgment:  Good  Impulse Control: Good   Risk Assessment: Danger to Self:  No Self-injurious Behavior: No Danger to Others: No Duty to Warn:no Physical Aggression / Violence:No  Access to Firearms a concern: No  Gang Involvement:No   Subjective: Pt shares that "I have been pretty good since our last session.  We have several camping trips coming up."  Pt shares that they are going to [place], [place] the first weekend in April and they are looking forward to that visit.  Pt shares that they had a great trip to [place] to visit friends and also had "an ok trip home to visit family and to remember my mom."  Pt shares that "sometimes I get overwhelmed with everybody there; there were 29 people at my dad's one day."  Pt shares that [name] is thinking about getting a degree in Healthcare Mgmt and wants to work in infection prevention or possibly with the [employer].  Pt shares that he and [name] are starting their garden plants indoors already and they are looking forward to starting their outdoor garden.  They have also been painting inside their home in the living room/den area.  Pt shares the dogs are doing well.  Pt shares that his drinking has decreased since getting back from vacation; they both drank a lot on  vacation and Tamm wants to get ready for the summer and to lose some weight; she has suggested a certain limit during the week and that is working for pt during the week.  Pt was on call this past weekend and he did not have to go in at all; he could not drink though and Tamm had quite a bit to drink.  Pt got steroids for his poison oak and is over it now.  Pt shares they had a good memorial service for pt's mom; is is somewhat concerned about his dad with how his dad is doing with the loss of his mom.  Pt has invited his dad to come visit them here this summer.  Pt shares that he feels his mood has improved since our last session; he attributes this to drinking less.  Encouraged pt to continue with his self care activities between now and our follow up session in 2 wks.   Interventions: Cognitive Behavioral Therapy  Diagnosis:Adjustment disorder with mixed anxiety and depressed mood  Plan: Treatment Plan Strengths/Abilities:  Intelligent, Intuitive, Willing to participate in therapy Treatment Preferences:  Outpatient Individual Therapy Statement of Needs:  Patient is to use CBT, mindfulness and coping skills to help manage and/or decrease symptoms associated with their diagnosis. Symptoms:  Depressed/Irritable mood, worry, social withdrawal Problems Addressed:  Depressive thoughts, Sadness, Sleep issues, etc. Long Term Goals:  Pt to reduce overall level, frequency, and intensity of the feelings of depression/anxiety as evidenced by  decreased irritability, negative self talk, and helpless feelings from 6 to 7 days/week to 0 to 1 days/week, per client report, for at least 3 consecutive months.  Progress: 10% Short Term Goals:  Pt to verbally express understanding of the relationship between feelings of depression/anxiety and their impact on thinking patterns and behaviors.  Pt to verbalize an understanding of the role that distorted thinking plays in creating fears, excessive worry, and ruminations.   Progress: 10% Target Date:  10/23/2022 Frequency:  Bi-weekly Modality:  Cognitive Behavioral Therapy Interventions by Therapist:  Therapist will use CBT, Mindfulness exercises, Coping skills and Referrals, as needed by client. Client has verbally approved this treatment plan.  Ivan Anchors, [facility]

## 2022-08-19 ENCOUNTER — Ambulatory Visit (INDEPENDENT_AMBULATORY_CARE_PROVIDER_SITE_OTHER): Payer: 59 | Admitting: Psychology

## 2022-08-19 DIAGNOSIS — F4323 Adjustment disorder with mixed anxiety and depressed mood: Secondary | ICD-10-CM

## 2022-08-19 NOTE — Progress Notes (Signed)
[facility] Counselor/Therapist Progress Note  Patient ID: Joseph Velasquez, MRN: [id number],    Date: 08/19/2022  Time Spent: 45 min  Treatment Type: Individual Therapy  Reported Symptoms: Pt presents in office for follow up session; granting consent for the session.  Mental Status Exam: Appearance:  Casual     Behavior: Appropriate  Motor: Normal  Speech/Language:  Clear and Coherent  Affect: Appropriate  Mood: normal  Thought process: normal  Thought content:   WNL  Sensory/Perceptual disturbances:   WNL  Orientation: oriented to person, place, and time/date  Attention: Good  Concentration: Good  Memory: WNL  Fund of knowledge:  Good  Insight:   Good  Judgment:  Good  Impulse Control: Good   Risk Assessment: Danger to Self:  No Self-injurious Behavior: No Danger to Others: No Duty to Warn:no Physical Aggression / Violence:No  Access to Firearms a concern: No  Gang Involvement:No   Subjective: Pt shares that "I have been pretty good since our last session.  We went camping this weekend near [place] and they enjoyed the time.  They got to visit with her aunt who is living on a boat with a guy she is dating.  They camped at a farm and had a great time doing that.  [name] is still applying to different schools so she can get her bachelor's degree.  Pt shares that they are losing another employee who is not the best employee but he is still a body who could work.  Encouraged pt to talk with his boss and ask him to try to get them some additional help in the dept.  Pt has been working in his yard and has enjoyed that.  They are going camping again the last weekend in April and pt is looking forward to that trip too.  They are working on getting their garden ready for the Spring and they both enjoy that activity too.  Pt shares he is having good days and bad days with his drinking; he did not over do it on the camping trip.  Pt talked to his dad yesterday on his dad's  birthday yesterday; his dad is 67 yo.  Pt and [name] continue to work on painting their home and they are making progress on it.  Pt has invited his dad to come visit them here this summer.  Pt shares that he feels his mood has improved since our last session; he attributes this to drinking less.  Encouraged pt to continue with his self care activities between now and our follow up session in 2 wks.   Interventions: Cognitive Behavioral Therapy  Diagnosis:Adjustment disorder with mixed anxiety and depressed mood  Plan: Treatment Plan Strengths/Abilities:  Intelligent, Intuitive, Willing to participate in therapy Treatment Preferences:  Outpatient Individual Therapy Statement of Needs:  Patient is to use CBT, mindfulness and coping skills to help manage and/or decrease symptoms associated with their diagnosis. Symptoms:  Depressed/Irritable mood, worry, social withdrawal Problems Addressed:  Depressive thoughts, Sadness, Sleep issues, etc. Long Term Goals:  Pt to reduce overall level, frequency, and intensity of the feelings of depression/anxiety as evidenced by decreased irritability, negative self talk, and helpless feelings from 6 to 7 days/week to 0 to 1 days/week, per client report, for at least 3 consecutive months.  Progress: 10% Short Term Goals:  Pt to verbally express understanding of the relationship between feelings of depression/anxiety and their impact on thinking patterns and behaviors.  Pt to verbalize an understanding of the  role that distorted thinking plays in creating fears, excessive worry, and ruminations.  Progress: 10% Target Date:  10/23/2022 Frequency:  Bi-weekly Modality:  Cognitive Behavioral Therapy Interventions by Therapist:  Therapist will use CBT, Mindfulness exercises, Coping skills and Referrals, as needed by client. Client has verbally approved this treatment plan.  Karie Kirks, [facility]

## 2022-09-02 ENCOUNTER — Ambulatory Visit: Payer: Commercial Managed Care - PPO | Admitting: Psychology

## 2022-09-04 ENCOUNTER — Other Ambulatory Visit: Payer: Self-pay

## 2022-09-16 ENCOUNTER — Ambulatory Visit (INDEPENDENT_AMBULATORY_CARE_PROVIDER_SITE_OTHER): Payer: 59 | Admitting: Psychology

## 2022-09-16 DIAGNOSIS — F4323 Adjustment disorder with mixed anxiety and depressed mood: Secondary | ICD-10-CM | POA: Diagnosis not present

## 2022-09-16 NOTE — Progress Notes (Signed)
[place] Counselor/Therapist Progress Note  Patient ID: Joseph Velasquez, MRN: [id number],    Date: 09/16/2022  Time Spent: 45 min  Treatment Type: Individual Therapy  Reported Symptoms: Pt presents in office for follow up session; granting consent for the session.  Mental Status Exam: Appearance:  Casual     Behavior: Appropriate  Motor: Normal  Speech/Language:  Clear and Coherent  Affect: Appropriate  Mood: normal  Thought process: normal  Thought content:   WNL  Sensory/Perceptual disturbances:   WNL  Orientation: oriented to person, place, and time/date  Attention: Good  Concentration: Good  Memory: WNL  Fund of knowledge:  Good  Insight:   Good  Judgment:  Good  Impulse Control: Good   Risk Assessment: Danger to Self:  No Self-injurious Behavior: No Danger to Others: No Duty to Warn:no Physical Aggression / Violence:No  Access to Firearms a concern: No  Gang Involvement:No   Subjective: Pt shares that "I have been really busy since our last session.  We still have the plumbing issues at our house and our little dog got attacked at the boarding facility and we have vet bills for that."  Pt shares he was helping a coworker build a patio this past weekend and it took the whole weekend.  He is really tired from working two weeks and this past weekend.  He is working at [employer] this month but has also had to go to [employer] every Velasquez this month.  Pt shares that, with Joseph Velasquez coming, he expects to be sad.  Talked with pt about focusing on positive memories of his mom in advance of Joseph Velasquez to have his mind in a positive frame.  He will try this to see if it is helpful.  Pt shares that he and Joseph Velasquez are doing well.  Their garden is coming along well.  Joseph Velasquez is back doing virtual school and is pursuing her bachelor's degree; she wants to work for the system in different capacity.  Pt shares he is worn out and is "looking forward to not doing much of anything this  weekend."  Pt shares his drinking has been under control because of all of the work he has been doing.  Pt has invited his dad to come visit them here this summer.  Encouraged pt to continue with his self care activities between now and our follow up session in 2 wks.   Interventions: Cognitive Behavioral Therapy  Diagnosis:Adjustment disorder with mixed anxiety and depressed mood  Plan: Treatment Plan Strengths/Abilities:  Intelligent, Intuitive, Willing to participate in therapy Treatment Preferences:  Outpatient Individual Therapy Statement of Needs:  Patient is to use CBT, mindfulness and coping skills to help manage and/or decrease symptoms associated with their diagnosis. Symptoms:  Depressed/Irritable mood, worry, social withdrawal Problems Addressed:  Depressive thoughts, Sadness, Sleep issues, etc. Long Term Goals:  Pt to reduce overall level, frequency, and intensity of the feelings of depression/anxiety as evidenced by decreased irritability, negative self talk, and helpless feelings from 6 to 7 days/week to 0 to 1 days/week, per client report, for at least 3 consecutive months.  Progress: 10% Short Term Goals:  Pt to verbally express understanding of the relationship between feelings of depression/anxiety and their impact on thinking patterns and behaviors.  Pt to verbalize an understanding of the role that distorted thinking plays in creating fears, excessive worry, and ruminations.  Progress: 10% Target Date:  10/23/2022 Frequency:  Bi-weekly Modality:  Cognitive Behavioral Therapy Interventions by Therapist:  Therapist will use CBT, Mindfulness exercises, Coping skills and Referrals, as needed by client. Client has verbally approved this treatment plan.  Ivan Anchors, [facility]

## 2022-09-30 ENCOUNTER — Ambulatory Visit (INDEPENDENT_AMBULATORY_CARE_PROVIDER_SITE_OTHER): Payer: 59 | Admitting: Psychology

## 2022-09-30 DIAGNOSIS — F4323 Adjustment disorder with mixed anxiety and depressed mood: Secondary | ICD-10-CM | POA: Diagnosis not present

## 2022-09-30 NOTE — Progress Notes (Signed)
[facility] [place] [facility] Counselor/Therapist Progress Note  Patient ID: Joseph Velasquez, MRN: [id number],    Date: 09/30/2022  Time Spent: 45 min  Treatment Type: Individual Therapy  Reported Symptoms: Pt presents in office for follow up session; granting consent for the session.  Mental Status Exam: Appearance:  Casual     Behavior: Appropriate  Motor: Normal  Speech/Language:  Clear and Coherent  Affect: Appropriate  Mood: normal  Thought process: normal  Thought content:   WNL  Sensory/Perceptual disturbances:   WNL  Orientation: oriented to person, place, and time/date  Attention: Good  Concentration: Good  Memory: WNL  Fund of knowledge:  Good  Insight:   Good  Judgment:  Good  Impulse Control: Good   Risk Assessment: Danger to Self:  No Self-injurious Behavior: No Danger to Others: No Duty to Warn:no Physical Aggression / Violence:No  Access to Firearms a concern: No  Gang Involvement:No   Subjective: Pt shares that "I have been working to get the tree stump out so the plumbers could come back to fix the pipe in the yard yesterday.  Pt shares that he is still real busy at work.  They have a posting for an open position but it appears they are not trying to hire anyone.  Pt is at [employer] [facility] this month so that I good for him since he lives in [place].  Their dog has healed up from his injuries at the doggie day care.  [name] is starting school in July and will start with one class per session.  Pt shares that this past weekend he did lots of work around the home and in his building; pt felt good about his productivity.  Pt shares that he struggled with Mother's Day weekend because his mom is passed.  They connected with Tamm's sister over the phone and talked with her and played games over the phone for a while.  Pt's brother rented a home in the [address] and invited pt and Tamm in July; pt is hopeful that his dad will come with his brother and his family.  Pt's garden is  coming along well; he got his first radishes out of the ground yesterday.  Pt shares his drinking has been pretty well controlled since our last session; encouraged pt to continue to monitor his use.  Pt shares that he wakes up tired daily; encouraged pt to talk with his PCP about a home sleep study.  Encouraged pt to continue with his self care activities between now and our follow up session in 2 wks.   Interventions: Cognitive Behavioral Therapy  Diagnosis:Adjustment disorder with mixed anxiety and depressed mood  Plan: Treatment Plan Strengths/Abilities:  Intelligent, Intuitive, Willing to participate in therapy Treatment Preferences:  Outpatient Individual Therapy Statement of Needs:  Patient is to use CBT, mindfulness and coping skills to help manage and/or decrease symptoms associated with their diagnosis. Symptoms:  Depressed/Irritable mood, worry, social withdrawal Problems Addressed:  Depressive thoughts, Sadness, Sleep issues, etc. Long Term Goals:  Pt to reduce overall level, frequency, and intensity of the feelings of depression/anxiety as evidenced by decreased irritability, negative self talk, and helpless feelings from 6 to 7 days/week to 0 to 1 days/week, per client report, for at least 3 consecutive months.  Progress: 10% Short Term Goals:  Pt to verbally express understanding of the relationship between feelings of depression/anxiety and their impact on thinking patterns and behaviors.  Pt to verbalize an understanding of the role that distorted thinking plays in  creating fears, excessive worry, and ruminations.  Progress: 10% Target Date:  10/23/2023 Frequency:  Bi-weekly Modality:  Cognitive Behavioral Therapy Interventions by Therapist:  Therapist will use CBT, Mindfulness exercises, Coping skills and Referrals, as needed by client. Client has verbally approved this treatment plan.  Karie Kirks, [facility]

## 2022-10-07 ENCOUNTER — Telehealth: Payer: 59 | Admitting: Nurse Practitioner

## 2022-10-07 DIAGNOSIS — L309 Dermatitis, unspecified: Secondary | ICD-10-CM | POA: Diagnosis not present

## 2022-10-07 NOTE — Progress Notes (Signed)
E Visit for Rash  We are sorry that you are not feeling well. Here is how we plan to help!  I would recommend restarting the cream that was prescribed at your previous visit:  Triamcinolone Cream (Kenalog) 0.1% apply twice daily   And starting an over the counter antihistamine (allergy medication) like Zyrtec 10mg    If you need a refill on either let us know   HOME CARE:  Take cool showers and avoid direct sunlight. Apply cool compress or wet dressings. Take a bath in an oatmeal bath.  Sprinkle content of one Aveeno packet under running faucet with comfortably warm water.  Bathe for 15-20 minutes, 1-2 times daily.  Pat dry with a towel. Do not rub the rash. Use hydrocortisone cream. Take an antihistamine like Benadryl for widespread rashes that itch.  The adult dose of Benadryl is 25-50 mg by mouth 4 times daily. Caution:  This type of medication may cause sleepiness.  Do not drink alcohol, drive, or operate dangerous machinery while taking antihistamines.  Do not take these medications if you have prostate enlargement.  Read package instructions thoroughly on all medications that you take.  GET HELP RIGHT AWAY IF:  Symptoms don't go away after treatment. Severe itching that persists. If you rash spreads or swells. If you rash begins to smell. If it blisters and opens or develops a yellow-brown crust. You develop a fever. You have a sore throat. You become short of breath.  MAKE SURE YOU:  Understand these instructions. Will watch your condition. Will get help right away if you are not doing well or get worse.  Thank you for choosing an e-visit.  Your e-visit answers were reviewed by a board certified advanced clinical practitioner to complete your personal care plan. Depending upon the condition, your plan could have included both over the counter or prescription medications.  Please review your pharmacy choice. Make sure the pharmacy is open so you can pick up prescription  now. If there is a problem, you may contact your provider through [employer] and have the prescription routed to another pharmacy.  Your safety is important to [place]. If you have drug allergies check your prescription carefully.   For the next 24 hours you can use MyChart to ask questions about today's visit, request a non-urgent call back, or ask for a work or school excuse. You will get an email in the next two days asking about your experience. I hope that your e-visit has been valuable and will speed your recovery.   I spent approximately 5 minutes reviewing the patient's history, current symptoms and coordinating their care today.

## 2022-10-14 ENCOUNTER — Ambulatory Visit: Payer: 59 | Admitting: Psychology

## 2023-01-13 ENCOUNTER — Other Ambulatory Visit: Payer: Self-pay

## 2023-01-13 ENCOUNTER — Telehealth: Payer: No Typology Code available for payment source | Admitting: Physician Assistant

## 2023-01-13 DIAGNOSIS — L237 Allergic contact dermatitis due to plants, except food: Secondary | ICD-10-CM | POA: Diagnosis not present

## 2023-01-13 MED ORDER — TRIAMCINOLONE ACETONIDE 0.1 % EX CREA
1.0000 | TOPICAL_CREAM | Freq: Two times a day (BID) | CUTANEOUS | 0 refills | Status: AC
Start: 2023-01-13 — End: ?
  Filled 2023-01-13: qty 30, 15d supply, fill #0

## 2023-01-13 MED ORDER — PREDNISONE 10 MG PO TABS
ORAL_TABLET | ORAL | 0 refills | Status: AC
  Filled 2023-01-13: qty 37, 14d supply, fill #0

## 2023-01-13 NOTE — Progress Notes (Signed)
I have spent 5 minutes in review of e-visit questionnaire, review and updating patient chart, medical decision making and response to patient.   William Cody Martin, PA-C    

## 2023-01-13 NOTE — Progress Notes (Signed)
E-Visit for [employer]  We are sorry that you are not feeing well.  Here is how we plan to help!  Based on what you have shared with me it looks like you have had an allergic reaction to the oily resin from a group of plants.  This resin is very sticky, so it easily attaches to your skin, clothing, tools equipment, and pet's fur.    This blistering rash is often called poison ivy rash although it can come from contact with the leaves, stems and roots of poison ivy, poison oak and poison sumac.  The oily resin contains urushiol (u-ROO-she-ol) that produces a skin rash on exposed skin.  The severity of the rash depends on the amount of urushiol that gets on your skin.  A section of skin with more urushiol on it may develop a rash sooner.  The rash usually develops 12-48 hours after exposure and can last two to three weeks.  Your skin must come in direct contact with the plant's oil to be affected.  Blister fluid doesn't spread the rash.  However, if you come into contact with a piece of clothing or pet fur that has urushiol on it, the rash may spread out.  You can also transfer the oil to other parts of your body with your fingers.  Often the rash looks like a straight line because of the way the plant brushes against your skin.  Since your rash is widespread or has resulted in a large number of blisters, I have prescribed an oral corticosteroid.  Please follow these recommendations:  I have sent a prednisone dose pack to your chosen pharmacy. Be sure to follow the instructions carefully and complete the entire prescription. You may use Benadryl or Caladryl topical lotions to sooth the itch and remember cool, not hot, showers and baths can help relieve the itching!  Place cool, wet compresses on the affected area for 15-30 minutes several times a day.  You may also take oral antihistamines, such as diphenhydramine (Benadryl, others), which may also help you sleep better.  Watch your skin for any purulent  (pus) drainage or red streaking from the site.  If this occurs, contact your provider.  You may require an antibiotic for a skin infection.  Make sure that the clothes you were wearing as well as any towels or sheets that may have come in contact with the oil (urushiol) are washed in detergent and hot water.       I have developed the following plan to treat your condition I am prescribing a two week course of steroids (37 tablets of 10 mg prednisone).  Days 1-4 take 4 tablets (40 mg) daily  Days 5-8 take 3 tablets (30 mg) daily, Days 9-11 take 2 tablets (20 mg) daily, Days 12-14 take 1 tablet (10 mg) daily.  I have also prescribed a topical cream, triamcinolone, to apply to more troublesome areas but avoid use on face, armpits or private areas.    What can you do to prevent this rash?  Avoid the plants.  Learn how to identify poison ivy, poison oak and poison sumac in all seasons.  When hiking or engaging in other activities that might expose you to these plants, try to stay on cleared pathways.  If camping, make sure you pitch your tent in an area free of these plants.  Keep pets from running through wooded areas so that urushiol doesn't accidentally stick to their fur, which you may touch.  Remove  or kill the plants.  In your yard, you can get rid of poison ivy by applying an herbicide or pulling it out of the ground, including the roots, while wearing heavy gloves.  Afterward remove the gloves and thoroughly wash them and your hands.  Don't burn poison ivy or related plants because the urushiol can be carried by smoke.  Wear protective clothing.  If needed, protect your skin by wearing socks, boots, pants, long sleeves and vinyl gloves.  Wash your skin right away.  Washing off the oil with soap and water within 30 minutes of exposure may reduce your chances of getting a poison ivy rash.  Even washing after an hour or so can help reduce the severity of the rash.  If you walk through some poison ivy  and then later touch your shoes, you may get some urushiol on your hands, which may then transfer to your face or body by touching or rubbing.  If the contaminated object isn't cleaned, the urushiol on it can still cause a skin reaction years later.    Be careful not to reuse towels after you have washed your skin.  Also carefully wash clothing in detergent and hot water to remove all traces of the oil.  Handle contaminated clothing carefully so you don't transfer the urushiol to yourself, furniture, rugs or appliances.  Remember that pets can carry the oil on their fur and paws.  If you think your pet may be contaminated with urushiol, put on some long rubber gloves and give your pet a bath.  Finally, be careful not to burn these plants as the smoke can contain traces of the oil.  Inhaling the smoke may result in difficulty breathing. If that occurred you should see a physician as soon as possible.  See your doctor right away if:  The reaction is severe or widespread You inhaled the smoke from burning poison ivy and are having difficulty breathing Your skin continues to swell The rash affects your eyes, mouth or genitals Blisters are oozing pus You develop a fever greater than 100 F (37.8 C) The rash doesn't get better within a few weeks.  If you scratch the poison ivy rash, bacteria under your fingernails may cause the skin to become infected.  See your doctor if pus starts oozing from the blisters.  Treatment generally includes antibiotics.  Poison ivy treatments are usually limited to self-care methods.  And the rash typically goes away on its own in two to three weeks.     If the rash is widespread or results in a large number of blisters, your doctor may prescribe an oral corticosteroid, such as prednisone.  If a bacterial infection has developed at the rash site, your doctor may give you a prescription for an oral antibiotic.  MAKE SURE YOU  Understand these instructions. Will  watch your condition. Will get help right away if you are not doing well or get worse.   Thank you for choosing an e-visit.  Your e-visit answers were reviewed by a board certified advanced clinical practitioner to complete your personal care plan. Depending upon the condition, your plan could have included both over the counter or prescription medications.  Please review your pharmacy choice. Make sure the pharmacy is open so you can pick up prescription now. If there is a problem, you may contact your provider through [employer] and have the prescription routed to another pharmacy.  Your safety is important to [place]. If you have drug allergies  check your prescription carefully.   For the next 24 hours you can use MyChart to ask questions about today's visit, request a non-urgent call back, or ask for a work or school excuse. You will get an email in the next two days asking about your experience. I hope that your e-visit has been valuable and will speed your recovery.
